# Patient Record
Sex: Male | Born: 1975
Health system: Southern US, Community
[De-identification: ages and names within clinical notes are randomized; demographics above are authoritative.]

## PROBLEM LIST (undated history)

## (undated) DIAGNOSIS — F32A Depression, unspecified: Secondary | ICD-10-CM

## (undated) DIAGNOSIS — M1990 Unspecified osteoarthritis, unspecified site: Secondary | ICD-10-CM

## (undated) DIAGNOSIS — Z8619 Personal history of other infectious and parasitic diseases: Secondary | ICD-10-CM

## (undated) DIAGNOSIS — F329 Major depressive disorder, single episode, unspecified: Secondary | ICD-10-CM

## (undated) DIAGNOSIS — F1911 Other psychoactive substance abuse, in remission: Secondary | ICD-10-CM

## (undated) DIAGNOSIS — Z789 Other specified health status: Secondary | ICD-10-CM

## (undated) DIAGNOSIS — F419 Anxiety disorder, unspecified: Secondary | ICD-10-CM

## (undated) HISTORY — DX: Depression, unspecified: F32.A

## (undated) HISTORY — DX: Major depressive disorder, single episode, unspecified: F32.9

## (undated) HISTORY — DX: Anxiety disorder, unspecified: F41.9

## (undated) HISTORY — DX: Unspecified osteoarthritis, unspecified site: M19.90

## (undated) HISTORY — DX: Other specified health status: Z78.9

## (undated) HISTORY — PX: SHOULDER ARTHROSCOPY: SHX128

## (undated) HISTORY — DX: Personal history of other infectious and parasitic diseases: Z86.19

## (undated) HISTORY — DX: Other psychoactive substance abuse, in remission: F19.11

---

## 2015-07-22 ENCOUNTER — Emergency Department (HOSPITAL_COMMUNITY)
Admission: EM | Admit: 2015-07-22 | Discharge: 2015-07-22 | Disposition: A | Discharge status: Home / Self Care | Payer: 59 | Attending: Emergency Medicine | Admitting: Emergency Medicine

## 2015-07-22 ENCOUNTER — Encounter (HOSPITAL_COMMUNITY): Payer: Self-pay | Admitting: *Deleted

## 2015-07-22 DIAGNOSIS — S01111A Laceration without foreign body of right eyelid and periocular area, initial encounter: Secondary | ICD-10-CM | POA: Diagnosis not present

## 2015-07-22 DIAGNOSIS — S0993XA Unspecified injury of face, initial encounter: Secondary | ICD-10-CM | POA: Diagnosis present

## 2015-07-22 DIAGNOSIS — Y9389 Activity, other specified: Secondary | ICD-10-CM | POA: Insufficient documentation

## 2015-07-22 DIAGNOSIS — Y998 Other external cause status: Secondary | ICD-10-CM | POA: Diagnosis not present

## 2015-07-22 DIAGNOSIS — W228XXA Striking against or struck by other objects, initial encounter: Secondary | ICD-10-CM | POA: Diagnosis not present

## 2015-07-22 DIAGNOSIS — Z23 Encounter for immunization: Secondary | ICD-10-CM | POA: Diagnosis not present

## 2015-07-22 DIAGNOSIS — Y9289 Other specified places as the place of occurrence of the external cause: Secondary | ICD-10-CM | POA: Insufficient documentation

## 2015-07-22 MED ORDER — TETANUS-DIPHTH-ACELL PERTUSSIS 5-2.5-18.5 LF-MCG/0.5 IM SUSP
0.5000 mL | Freq: Once | INTRAMUSCULAR | Status: AC
  Administered 2015-07-22: 0.5 mL via INTRAMUSCULAR
  Filled 2015-07-22: qty 0.5

## 2015-07-22 MED ORDER — LIDOCAINE-EPINEPHRINE (PF) 2 %-1:200000 IJ SOLN
20.0000 mL | Freq: Once | INTRAMUSCULAR | Status: AC
  Administered 2015-07-22: 20 mL
  Filled 2015-07-22: qty 20

## 2015-07-22 NOTE — Discharge Instructions (Signed)
Keep wound clean with mild soap and water. Keep area covered with a topical antibiotic ointment and bandage, keep bandage dry, and do not submerge in water for 24 hours. Ice and elevate for additional pain relief and swelling. Alternate between Naprosyn and Tylenol for additional pain relief. Follow up with your primary care doctor or the [facility] in approximately 7-10 days for wound recheck and suture removal. Monitor area for signs of infection to include, but not limited to: increasing pain, redness, drainage/pus, or swelling. Return to emergency department for emergent changing or worsening symptoms.    Facial Laceration A facial laceration is a cut on the face. These injuries can be painful and cause bleeding. Some cuts may need to be closed with stitches (sutures), skin adhesive strips, or wound glue. Cuts usually heal quickly but can leave a scar. It can take 1-2 years for the scar to go away completely. HOME CARE   Only take medicines as told by your doctor.  Follow your doctor's instructions for wound care. For Stitches:  Keep the cut clean and dry.  If you have a bandage (dressing), change it at least once a day. Change the bandage if it gets wet or dirty, or as told by your doctor.  Wash the cut with soap and water 2 times a day. Rinse the cut with water. Pat it dry with a clean towel.  Put a thin layer of medicated cream on the cut as told by your doctor.  You may shower after the first 24 hours. Do not soak the cut in water until the stitches are removed.  Have your stitches removed as told by your doctor.  Do not wear any makeup until a few days after your stitches are removed. For Skin Adhesive Strips:  Keep the cut clean and dry.  Do not get the strips wet. You may take a bath, but be careful to keep the cut dry.  If the cut gets wet, pat it dry with a clean towel.  The strips will fall off on their own. Do not remove the strips that are still  stuck to the cut. For Wound Glue:  You may shower or take baths. Do not soak or scrub the cut. Do not swim. Avoid heavy sweating until the glue falls off on its own. After a shower or bath, pat the cut dry with a clean towel.  Do not put medicine or makeup on your cut until the glue falls off.  If you have a bandage, do not put tape over the glue.  Avoid lots of sunlight or tanning lamps until the glue falls off.  The glue will fall off on its own in 5-10 days. Do not pick at the glue. After Healing: Put sunscreen on the cut for the first year to reduce your scar. GET HELP RIGHT AWAY IF:   Your cut area gets red, painful, or puffy (swollen).  You see a yellowish-white fluid (pus) coming from the cut.  You have chills or a fever. MAKE SURE YOU:   Understand these instructions.  Will watch your condition.  Will get help right away if you are not doing well or get worse. Document Released: 05/13/2008 Document Revised: 09/15/2013 Document Reviewed: 07/08/2013 [facility] Patient Information 2015 [place], [place]. This information is not intended to replace advice given to you by your health care provider. Make sure you discuss any questions you have with your health care provider.   Sutured Wound Care Sutures are  stitches that can be used to close wounds. Caring for your wound can help stop infection and lessen pain. HOME CARE   Rest and raise (elevate) the injured area until the pain and puffiness (swelling) go away.  Only take medicines as told by your doctor.  Clean the wound gently with mild soap and water once a day after the first 2 days. Rinse off the soap. Pat the area dry with a clean towel. Do not rub the wound.  Change the bandage (dressing) as told by your doctor. If the bandage sticks, soak it off with soapy water. Stop using a bandage after 2 days or after the wound stops leaking fluid.  Put cream on the wound as told by your doctor.  Do not stretch the  wound.  Drink enough fluids to keep your pee (urine) clear or pale yellow.  See your doctor to have the sutures removed.  Use sunscreen or sunblock on the wound after it heals. GET HELP RIGHT AWAY IF:   Your wound gets red, puffy, hot, or tender.  You have more pain in the wound.  You have a red streak that goes away from the wound.  You see yellowish-white fluid (pus) coming out of the wound.  You have a fever.  You have chills and start to shake.  You notice a bad smell coming from the wound.  Your wound will not stop bleeding. MAKE SURE YOU:   Understand these instructions.  Will watch your condition.  Will get help right away if you are not doing well or get worse. Document Released: 05/13/2008 Document Revised: 02/17/2012 Document Reviewed: 03/31/2011 [facility] Patient Information 2015 [place], [place]. This information is not intended to replace advice given to you by your health care provider. Make sure you discuss any questions you have with your health care provider.

## 2015-07-22 NOTE — ED Notes (Signed)
Pt has 2.5cm laceration to right eyebrow. Pt states he was hit with knee while playing ju jitsu. Pt denies eye pain.

## 2015-07-22 NOTE — ED Provider Notes (Signed)
CSN: [id number]     Arrival date & time 07/22/15  1440 History  This chart was scribed for Chase Strauss, PA-C, working with Laurence Spates, MD by Chestine Spore, ED Scribe. The patient was seen in room WTR8/WTR8 at 3:05 PM.    Chief Complaint  Patient presents with  . Facial Laceration      Patient is a 39 y.o. male presenting with skin laceration. The history is provided by the patient. No language interpreter was used.  Laceration Location:  Face Facial laceration location:  R eyebrow Length (cm):  2.5 cm Depth:  Cutaneous Quality: straight   Bleeding: controlled   Time since incident:  3 hours Injury mechanism: knee while practicing ju jitsu. Pain details:    Quality:  Unable to specify   Severity:  No pain   Progression:  Unchanged Foreign body present:  No foreign bodies Relieved by: neosporin, co-band, and gauze. Worsened by:  Nothing tried Ineffective treatments:  None tried Tetanus status:  Out of date   Chase Nunez is a 39 y.o. male with no significant PMHx, who presents to the Emergency department complaining of right eyebrow laceration onset 3 hours ago PTA. Pt reports that he was practicing ju jitsu when he was hit in the right eyebrow with a knee. Pt denies pain. He notes that he has used neosporin, co-band, and gauze to the lac PTA, which helped stop the bleeding. He denies blurred vision, eye pain, photophobia, vision disturbance, HA, lightheaded, LOC, back pain, neck pain, CP, SOB, abdominal pain, n/v/d/c, hematuria, dysuria, bowel/bladder incontinence, numbness, tingling, weakness, bruising, or swelling. Pt is not allergic to any medications. Pt denies DM, HIV, other immunocompromising conditions, or any bleeding issues. Pt notes that he believes that his last tetanus shot was within the last 67yrs.   History reviewed. No pertinent past medical history. History reviewed. No pertinent past surgical history. No family history on file. Social History   Substance Use Topics  . Smoking status: Never Smoker   . Smokeless tobacco: None  . Alcohol Use: Yes    Review of Systems  Constitutional: Negative for fever and chills.  HENT: Negative for facial swelling.   Eyes: Negative for photophobia, pain and visual disturbance.  Respiratory: Negative for shortness of breath.   Cardiovascular: Negative for chest pain.  Gastrointestinal: Negative for nausea, vomiting, abdominal pain and diarrhea.       No bowel incontinence  Genitourinary: Negative for dysuria, hematuria and difficulty urinating.       No bladder incontinence  Musculoskeletal: Negative for back pain, joint swelling, arthralgias, gait problem and neck pain.  Skin: Positive for wound (right eyebrow laceration).  Allergic/Immunologic: Negative for immunocompromised state.  Neurological: Negative for syncope, weakness, light-headedness, numbness and headaches.       No tingling  Hematological: Does not bruise/bleed easily.   A complete 10 system review of systems was obtained and all systems are negative except as noted in the HPI and PMH.     Allergies  Review of patient's allergies indicates not on file.  Home Medications   Prior to Admission medications   Not on File   BP 141/77 mmHg  Pulse 53  Temp(Src) 98.6 F (37 C) (Oral)  Resp 18  SpO2 97% Physical Exam  Constitutional: He is oriented to person, place, and time. Vital signs are normal. He appears well-developed and well-nourished.  Non-toxic appearance. No distress.  Afebrile, nontoxic, NAD  HENT:  Head: Normocephalic. Head is with laceration. Head is without  raccoon's eyes, without Battle's sign and without contusion.  Mouth/Throat: Mucous membranes are normal.  Approximately 2.5 cm laceration to the right eyebrow, bleeding controlled, with no scalp or facial TTP, no racoon eyes or battle's sign, no contusion. No facial asymmetry  Eyes: Conjunctivae and EOM are normal. Pupils are equal, round, and reactive  to light. Right eye exhibits no discharge. Left eye exhibits no discharge.  PERRL, EOMI, no nystagmus, no visual field deficits. Right eyebrow lac as noted above. SEE PICTURE BELOW Visual Acuity - Bilateral Distance: 20/15 ; R Distance: 20/20 ; L Distance: 20/25  Neck: Normal range of motion. Neck supple.  Cardiovascular: Normal rate.   Pulmonary/Chest: Effort normal. No respiratory distress.  Abdominal: Normal appearance. He exhibits no distension.  Musculoskeletal: Normal range of motion.  Neurological: He is alert and oriented to person, place, and time. He has normal strength. No sensory deficit.  Skin: Skin is warm and dry. Laceration noted. No rash noted.  Psychiatric: He has a normal mood and affect.  Nursing note and vitals reviewed.       ED Course  LACERATION REPAIR Date/Time: 07/22/2015 3:20 PM Performed by: Allen Derry Authorized by: Allen Derry Consent: Verbal consent obtained. Risks and benefits: risks, benefits and alternatives were discussed Consent given by: patient Patient understanding: patient states understanding of the procedure being performed Patient consent: the patient's understanding of the procedure matches consent given Patient identity confirmed: verbally with patient Body area: head/neck Location details: right eyebrow Laceration length: 2.5 cm Foreign bodies: no foreign bodies Tendon involvement: none Nerve involvement: none Vascular damage: no Anesthesia: local infiltration Local anesthetic: lidocaine 2% without epinephrine and lidocaine 2% with epinephrine Anesthetic total: 3 ml Patient sedated: no Preparation: Patient was prepped and draped in the usual sterile fashion. Irrigation solution: saline Irrigation method: syringe Amount of cleaning: standard Debridement: none Degree of undermining: none Skin closure: 5-0 Prolene Number of sutures: 3 Technique: simple Approximation: close Approximation difficulty:  simple Dressing: 4x4 sterile gauze and antibiotic ointment Patient tolerance: Patient tolerated the procedure well with no immediate complications   (including critical care time) DIAGNOSTIC STUDIES: Oxygen Saturation is 97% on RA, nl by my interpretation.    COORDINATION OF CARE: 3:10 PM Discussed treatment plan with pt at bedside and pt agreed to plan.  3:19 PM- 2% Lidocaine with Epinephrine, 3 ml used, 5-0 Prolene, 3 sutures placed to right eyebrow.   Labs Review Labs Reviewed - No data to display  Imaging Review No results found. I, Camprubi[place]Soms, [place], personally reviewed and evaluated these images and lab results as part of my medical decision-making.   EKG Interpretation None      MDM   Final diagnoses:  Eyebrow laceration, right, initial encounter    39 y.o. male here with R eyebrow lac sustained just PTA. No pain. No bruising or tenderness. No focal neuro deficits. Sutured with 5-0 prolene, discussed f/up in 7-10 days for suture removal at PCPs office. Discussed proper wound care. Tetanus updated. I explained the diagnosis and have given explicit precautions to return to the ER including for any other new or worsening symptoms. The patient understands and accepts the medical plan as it's been dictated and I have answered their questions. Discharge instructions concerning home care and prescriptions have been given. The patient is STABLE and is discharged to home in good condition.   I personally performed the services described in this documentation, which was scribed in my presence. The recorded information has been reviewed and is accurate.  BP 141/77 mmHg  Pulse 53  Temp(Src) 98.6 F (37 C) (Oral)  Resp 18  SpO2 97%  Meds ordered this encounter  Medications  . lidocaine-EPINEPHrine (XYLOCAINE W/EPI) 2 %-1:200000 (PF) injection 20 mL    Sig:   . Tdap (BOOSTRIX) injection 0.5 mL    SigAllen Derry, PA-C 07/22/15 1556  Laurence Spates, MD 07/23/15 [phone]

## 2017-01-31 MED FILL — FLUoxetine HCL 20 MG CAPS: 20 | 30 days supply | Qty: 60 | Fill #0

## 2017-03-03 MED FILL — FLUoxetine HCL 20 MG CAPS: 20 | 30 days supply | Qty: 60 | Fill #1

## 2017-04-08 ENCOUNTER — Ambulatory Visit (INDEPENDENT_AMBULATORY_CARE_PROVIDER_SITE_OTHER): Payer: 59 | Admitting: Family Medicine

## 2017-04-08 ENCOUNTER — Encounter: Payer: Self-pay | Admitting: Family Medicine

## 2017-04-08 VITALS — BP 148/92 | HR 56 | Temp 98.3°F | Resp 17 | Ht 66.5 in | Wt 210.0 lb

## 2017-04-08 DIAGNOSIS — F39 Unspecified mood [affective] disorder: Secondary | ICD-10-CM | POA: Diagnosis not present

## 2017-04-08 DIAGNOSIS — F429 Obsessive-compulsive disorder, unspecified: Secondary | ICD-10-CM

## 2017-04-08 DIAGNOSIS — Z1329 Encounter for screening for other suspected endocrine disorder: Secondary | ICD-10-CM

## 2017-04-08 DIAGNOSIS — F411 Generalized anxiety disorder: Secondary | ICD-10-CM | POA: Diagnosis not present

## 2017-04-08 DIAGNOSIS — G894 Chronic pain syndrome: Secondary | ICD-10-CM

## 2017-04-08 DIAGNOSIS — Z5181 Encounter for therapeutic drug level monitoring: Secondary | ICD-10-CM

## 2017-04-08 DIAGNOSIS — R4586 Emotional lability: Secondary | ICD-10-CM

## 2017-04-08 MED ORDER — DULOXETINE HCL 30 MG PO CPEP: 30.0000 mg | ORAL_CAPSULE | Freq: Two times a day (BID) | ORAL | 0 refills | Status: DC

## 2017-04-08 MED FILL — DULoxetine HCL 30 MG CPEP: 30 | 30 days supply | Qty: 60 | Fill #0

## 2017-04-08 NOTE — Patient Instructions (Addendum)
Stop Prozac, start Cymbalta 1 pill per day for the next 5-7 days, then increase to twice per day as long as you tolerate that dose. Follow-up with me within 3-4 weeks so you do not run out of medication. If you are tolerating the 30 mg twice per day at that time, we will switch to a once a day formulation. Decrease alcohol to no more than 1 drink per day. If you have difficulty cutting back, let me know and I can provide some resources. Let me know if you have questions in the meantime    IF you received an x-ray today, you will receive an invoice from [facility]. Please contact [facility] at [phone] with questions or concerns regarding your invoice.   IF you received labwork today, you will receive an invoice from [place]. Please contact [facility] at [phone] with questions or concerns regarding your invoice.   Our billing staff will not be able to assist you with questions regarding bills from these companies.  You will be contacted with the lab results as soon as they are available. The fastest way to get your results is to activate your My Chart account. Instructions are located on the last page of this paperwork. If you have not heard from [place] regarding the results in 2 weeks, please contact this office.

## 2017-04-08 NOTE — Progress Notes (Signed)
By signing my name below, I, [name], attest that this documentation has been prepared under the direction and in the presence of Meredith Staggers, MD.  Electronically Signed: Arvilla Market, Medical Scribe. 04/08/17. 12:23 PM.  Subjective:    Patient ID: Chase Nunez, male    DOB: May 08, 1976, 41 y.o.   MRN: [id number]  HPI Chief Complaint  Patient presents with  . Establish Care    HPI Comments: Chase Nunez is a 41 y.o. male who presents to the Primary Care at [facility] to establish care.   OCD/Anxiety: Pt has been taking fluoxetine 20 mg BID for OCD and anxiety, but for the past week he's been taking it QD since he would run out soon. His last dosage was this morning. States it's been a "roller coaster" with more anxiety, and easy agitation that last 30 mins until he's clear headed later. He's been drinking more heavily, 14 a week (2 liquor drinks a day and some days more drinks). Pt goes through a routine before leaving the house since he doesn't want to do anything when he gets back home.  FHx: Dad had thyroid issues - night sweats.  Pain: Reports upper back pain, bilateral arm pain, and it feels llike someone is peeling his scapula. Reports shoulder surgery in the past but states it's fine now. 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 He's in constant pain, especially after he gets home . Pt is trying CBD oil without THC for relief of his pain. Denies SI, and depression.   Pt has a competition this weekend.  There are no active problems to display for this patient.  Past Medical History:  Diagnosis Date  . Arthritis   . Depression    Past Surgical History:  Procedure Laterality Date  . SHOULDER ARTHROSCOPY     Not on File Prior to Admission medications   Not on File   Social History   Social History  . Marital status: Married    Spouse name: N/A  . Number of children: N/A  . Years of education: N/A   Occupational History  . Not on  file.   Social History Main Topics  . Smoking status: Never Smoker  . Smokeless tobacco: Current User    Types: Snuff  . Alcohol use Yes  . Drug use: No  . Sexual activity: No   Other Topics Concern  . Not on file   Social History Narrative  . No narrative on file   Review of Systems  Musculoskeletal: Positive for back pain and myalgias.  Psychiatric/Behavioral: Positive for agitation. Negative for dysphoric mood, self-injury and suicidal ideas. The patient is nervous/anxious.    Objective:  Physical Exam  Constitutional: He appears well-developed and well-nourished. No distress.  HENT:  Head: Normocephalic and atraumatic.  Eyes: Conjunctivae are normal.  Neck: Neck supple.  Cardiovascular: Normal rate, regular rhythm and normal heart sounds.  Exam reveals no gallop and no friction rub.   No murmur heard. Pulmonary/Chest: Effort normal and breath sounds normal. No respiratory distress. He has no decreased breath sounds. He has no wheezes. He has no rhonchi. He has no rales.  Neurological: He is alert.  Skin: Skin is warm and dry.  Psychiatric: He has a normal mood and affect. His behavior is normal.  Nursing note and vitals reviewed.   Vitals:   04/08/17 1144  BP: (!) 148/92  Pulse: (!) 56  Resp: 17  Temp: 98.3 F (36.8  C)  TempSrc: Oral  SpO2: 99%  Weight: 210 lb (95.3 kg)  Height: 5' 6.5" (1.689 m)   Body mass index is 33.39 kg/m. Assessment & Plan:   Chase Nunez is a 41 y.o. male Generalized anxiety disorder - Plan: TSH, DULoxetine (CYMBALTA) 30 MG capsule, Care order/instruction: Mood swings (HCC) - Plan: TSH, DULoxetine (CYMBALTA) 30 MG capsule Obsessive-compulsive disorder, unspecified type - Plan: TSH, DULoxetine (CYMBALTA) 30 MG capsule  - History of OCD, but on discussion likely has component of generalized anxiety with recent worsening in mood swings.  - With chronic pain component, decided to try Cymbalta. Start 30 mg daily for 4-5 days at least, then  changed to 30 mg twice a day if tolerated. Recheck in 3-4 weeks and if tolerated and 60 mg per day dose, will switch to 60 mg capsule daily.  - Advised to decrease alcohol use to no more than 1 drink per day. If difficulty cutting back, can provide resources.  - Check TSH  Screening for thyroid disorder - Plan: TSH  Medication monitoring encounter - Plan: Basic metabolic panel  - Check creatinine for Cymbalta. Plans on cutting wait for competition soon. Maintain hydration for renal function.  Chronic pain syndrome  - As above will try Cymbalta, and advised to follow-up to discuss specific areas of pain further as may need other treatment or evaluation.  Meds ordered this encounter  Medications  . DISCONTD: FLUoxetine (PROZAC) 20 MG tablet    Sig: Take 20 mg by mouth daily.  . Multiple Vitamins-Minerals (MULTIVITAMIN WITH MINERALS) tablet    Sig: Take 1 tablet by mouth daily.  . Turmeric 500 MG CAPS    Sig: Take by mouth.  . DULoxetine (CYMBALTA) 30 MG capsule    Sig: Take 1 capsule (30 mg total) by mouth 2 (two) times daily. Start with 1 capsule QD for 1 week., then increase to BID.    Dispense:  60 capsule    Refill:  0   Patient Instructions   Stop Prozac, start Cymbalta 1 pill per day for the next 5-7 days, then increase to twice per day as long as you tolerate that dose. Follow-up with me within 3-4 weeks so you do not run out of medication. If you are tolerating the 30 mg twice per day at that time, we will switch to a once a day formulation. Decrease alcohol to no more than 1 drink per day. If you have difficulty cutting back, let me know and I can provide some resources. Let me know if you have questions in the meantime    IF you received an x-ray today, you will receive an invoice from [facility]. Please contact [facility] at [phone] with questions or concerns regarding your invoice.   IF you received labwork today, you will receive an invoice from  [place]. Please contact [facility] at [phone] with questions or concerns regarding your invoice.   Our billing staff will not be able to assist you with questions regarding bills from these companies.  You will be contacted with the lab results as soon as they are available. The fastest way to get your results is to activate your My Chart account. Instructions are located on the last page of this paperwork. If you have not heard from [place] regarding the results in 2 weeks, please contact this office.       I personally performed the services described in this documentation, which was scribed in my presence. The recorded information has been  reviewed and considered for accuracy and completeness, addended by me as needed, and agree with information above.  Signed,   Meredith Staggers, MD Primary Care at [facility].  04/09/17 9:22 AM

## 2017-04-09 LAB — BASIC METABOLIC PANEL
BUN/Creatinine Ratio: 20 (ref 9–20)
BUN: 16 mg/dL (ref 6–20)
CO2: 25 mmol/L (ref 18–29)
Calcium: 9.5 mg/dL (ref 8.7–10.2)
Chloride: 99 mmol/L (ref 96–106)
Creatinine, Ser: 0.8 mg/dL (ref 0.76–1.27)
GFR calc Af Amer: 131 mL/min/{1.73_m2} (ref >59)
GFR calc non Af Amer: 113 mL/min/{1.73_m2} (ref >59)
Glucose: 106 mg/dL — ABNORMAL HIGH (ref 65–99)
Potassium: 5.2 mmol/L (ref 3.5–5.2)
Sodium: 138 mmol/L (ref 134–144)

## 2017-04-09 LAB — TSH: TSH: 2.13 u[IU]/mL (ref 0.450–4.500)

## 2017-05-07 ENCOUNTER — Telehealth: Payer: Self-pay | Admitting: Family Medicine

## 2017-05-07 NOTE — Telephone Encounter (Signed)
FROM LAST OFFICE VISIT NOTE:  Stop Prozac, start Cymbalta 1 pill per day for the next 5-7 days, then increase to twice per day as long as you tolerate that dose. Follow-up with me within 3-4 weeks so you do not run out of medication   HE NEEDS NEEDS AN APPT THIS WEEK, PLEASE SCHEDULE

## 2017-05-07 NOTE — Telephone Encounter (Addendum)
PATIENT WOULD LIKE DR. GREENE TO KNOW THAT HE WILL RUN OUT OF HIS CYMBALTA 30 MG THIS WEEK FOR HIS ANXIETY. HE SAID DR. GREENE TOLD HIM THAT HE WAS GOING TO INCREASE HIS DOSAGE BUT HE IS NOT SURE HOW MUCH? HE ALSO IS NOT SURE WHEN DR. Neva SeatGREENE TOLD HIM TO SCHEDULE HIS NEXT OFFICE VISIT APPOINTMENT WITH HIM? BEST PHONE [phone] (CELL) PHARMACY CHOICE IS [place] [facility]. MBC

## 2017-05-08 ENCOUNTER — Encounter: Payer: Self-pay | Admitting: Family Medicine

## 2017-05-08 ENCOUNTER — Ambulatory Visit (INDEPENDENT_AMBULATORY_CARE_PROVIDER_SITE_OTHER): Payer: 59 | Admitting: Family Medicine

## 2017-05-08 VITALS — BP 130/81 | HR 65 | Temp 98.0°F | Resp 18 | Ht 66.5 in | Wt 205.0 lb

## 2017-05-08 DIAGNOSIS — R4586 Emotional lability: Secondary | ICD-10-CM

## 2017-05-08 DIAGNOSIS — F39 Unspecified mood [affective] disorder: Secondary | ICD-10-CM

## 2017-05-08 DIAGNOSIS — Z789 Other specified health status: Secondary | ICD-10-CM

## 2017-05-08 DIAGNOSIS — F411 Generalized anxiety disorder: Secondary | ICD-10-CM | POA: Diagnosis not present

## 2017-05-08 DIAGNOSIS — Z7289 Other problems related to lifestyle: Secondary | ICD-10-CM

## 2017-05-08 DIAGNOSIS — F429 Obsessive-compulsive disorder, unspecified: Secondary | ICD-10-CM | POA: Diagnosis not present

## 2017-05-08 DIAGNOSIS — M791 Myalgia, unspecified site: Secondary | ICD-10-CM

## 2017-05-08 MED ORDER — DULOXETINE HCL 60 MG PO CPEP: 60.0000 mg | ORAL_CAPSULE | Freq: Every day | ORAL | 1 refills | Status: DC

## 2017-05-08 MED FILL — DULoxetine HCL 60 MG CPEP: 60 | 90 days supply | Qty: 90 | Fill #0 | Status: TO

## 2017-05-08 NOTE — Patient Instructions (Addendum)
  Continue 60mg  Cymbalta once per day. If still having some increased anxiety in next 3-4 weeks, could add 30mg  dose.   If you continue to have difficulty with sexual side effects of your medicine, or that worsens - can look at other options or possibly add Wellbutrin. No change for now.   Recheck in 3 months.    IF you received an x-ray today, you will receive an invoice from [facility]. Please contact [facility] at [phone] with questions or concerns regarding your invoice.   IF you received labwork today, you will receive an invoice from [facility]. Please contact [facility] at [phone] with questions or concerns regarding your invoice.   Our billing staff will not be able to assist you with questions regarding bills from these companies.  You will be contacted with the lab results as soon as they are available. The fastest way to get your results is to activate your My Chart account. Instructions are located on the last page of this paperwork. If you have not heard from us regarding the results in 2 weeks, please contact this office.

## 2017-05-08 NOTE — Progress Notes (Signed)
By signing my name below, I, [name], attest that this documentation has been prepared under the direction and in the presence of Meredith StaggersJeffrey Yosselyn Tax, MD.  Electronically Signed: Arvilla MarketMesha Guinyard, Medical Scribe. 05/08/17. 11:06 AM.  Subjective:    Patient ID: Chase CosierJames Nunez, male    DOB: 01/06/1976, 41 y.o.   MRN: [id number]  HPI Chief Complaint  Patient presents with  . medication change    Cymbalta and to increase the dose  . Follow-up    HPI Comments: Chase CosierJames Caterino is a 41 y.o. male who [facility] for anxiety follow-up. He was last seen May 1st to establish care. He was having inc anxiety on lower dose of prozac at that time. Increased alcohol, was having some diffuse myalgias; thought to be in part to overuse, and some component of chronic pain. We decided to changing to cymbalta 30 mg to see if that would help chronic pain component as well as anxiety and depression. He was advised to increase if tolerated and to decreased alcohol use. TSH was nl.  He's currently taking cymbalta 30 mg BID (occasionally both in the morning and occasionally 1 in the moring and 1 in the afternoon), and initially took it QD the first 1.5 weeks with relief of his sxs. His wife has noticed improved mood and  he has become more relaxed on his routine of having things put up in the house. In the past he would check multiple times, but he does it less frequently. Pt's pain has also been relieved since starting cymbalta, but he has noticed prolonged orgasms (trouble "finishing"). He drinks less, now at least 1 drink late night before bed on the weekdays. Denies SI/HI, and thoughts of self harm.   Hyperglycemia: Borderline glucose of 106  There are no active problems to display for this patient.  Past Medical History:  Diagnosis Date  . Arthritis   . Depression    Past Surgical History:  Procedure Laterality Date  . SHOULDER ARTHROSCOPY     No Known Allergies Prior to Admission  medications   Medication Sig Start Date End Date Taking? Authorizing Provider  DULoxetine (CYMBALTA) 30 MG capsule Take 1 capsule (30 mg total) by mouth 2 (two) times daily. Start with 1 capsule QD for 1 week., then increase to BID. 04/08/17  Yes Shade FloodGreene, Damarkus Balis R, MD  Multiple Vitamins-Minerals (MULTIVITAMIN WITH MINERALS) tablet Take 1 tablet by mouth daily.   Yes [provider]  Turmeric 500 MG CAPS Take by mouth.   Yes [provider]   Social History   Social History  . Marital status: Married    Spouse name: N/A  . Number of children: N/A  . Years of education: N/A   Occupational History  . Not on file.   Social History Main Topics  . Smoking status: Never Smoker  . Smokeless tobacco: Current User    Types: Snuff  . Alcohol use Yes  . Drug use: No  . Sexual activity: No   Other Topics Concern  . Not on file   Social History Narrative  . No narrative on file   Review of Systems  Musculoskeletal: Negative for myalgias.  Psychiatric/Behavioral: Negative for agitation, dysphoric mood, self-injury and suicidal ideas. The patient is not nervous/anxious.    Objective:  Physical Exam  Constitutional: He appears well-developed and well-nourished. No distress.  HENT:  Head: Normocephalic and atraumatic.  Eyes: Conjunctivae are normal.  Neck: Neck supple.  Cardiovascular: Normal rate, regular  rhythm and normal heart sounds.  Exam reveals no gallop and no friction rub.   No murmur heard. Pulmonary/Chest: Effort normal and breath sounds normal. No respiratory distress. He has no wheezes. He has no rales.  Neurological: He is alert.  Skin: Skin is warm and dry.  Psychiatric: He has a normal mood and affect. His behavior is normal.  Nursing note and vitals reviewed.   Vitals:   05/08/17 1033  BP: (!) 147/87  Pulse: 65  Resp: 18  Temp: 98 F (36.7 C)  TempSrc: Oral  SpO2: 99%  Weight: 205 lb (93 kg)  Height: 5' 6.5" (1.689 m)  Body mass index is  32.59 kg/m. Assessment & Plan:   Marquee Fuchs is a 41 y.o. male Obsessive-compulsive disorder, unspecified type - Plan: DULoxetine (CYMBALTA) 60 MG capsule Generalized anxiety disorder - Plan: DULoxetine (CYMBALTA) 60 MG capsule Mood swings (HCC) - Plan: DULoxetine (CYMBALTA) 60 MG capsule  - Improved with Cymbalta, discussed typical side effects of this medication including sexual side effects. Declined any changes at this time, will continue Cymbalta 60 mg daily, recheck in 3 months  Alcohol use  -Stable, has decreased use, continue to avoid heavy use.  Myalgia  -Improved with use of Cymbalta. RTC precautions if acute worsening of one area  Meds ordered this encounter  Medications  . DULoxetine (CYMBALTA) 60 MG capsule    Sig: Take 1 capsule (60 mg total) by mouth daily.    Dispense:  90 capsule    Refill:  1   Patient Instructions    Continue 60mg  Cymbalta once per day. If still having some increased anxiety in next 3-4 weeks, could add 30mg  dose.   If you continue to have difficulty with sexual side effects of your medicine, or that worsens - can look at other options or possibly add Wellbutrin. No change for now.   Recheck in 3 months.    IF you received an x-ray today, you will receive an invoice from [facility]. Please contact [facility] at [phone] with questions or concerns regarding your invoice.   IF you received labwork today, you will receive an invoice from [facility]. Please contact [facility] at [phone] with questions or concerns regarding your invoice.   Our billing staff will not be able to assist you with questions regarding bills from these companies.  You will be contacted with the lab results as soon as they are available. The fastest way to get your results is to activate your My Chart account. Instructions are located on the last page of this paperwork. If you have not heard from [place] regarding the results in 2 weeks, please contact  this office.       I personally performed the services described in this documentation, which was scribed in my presence. The recorded information has been reviewed and considered for accuracy and completeness, addended by me as needed, and agree with information above.  Signed,   Meredith Staggers, MD Primary Care at [facility].  05/10/17 11:53 AM

## 2017-08-13 ENCOUNTER — Telehealth: Payer: Self-pay | Admitting: Family Medicine

## 2017-08-13 NOTE — Telephone Encounter (Signed)
Pt states that he is out of town and forgot to go by [facility] and get his last refill on the cymbalta and they are [facility] to [facility] [place] phone number [phone] and pt was hoping that Dr. Neva SeatGreene could call in a [facility] at the [facility]   Best number [phone]

## 2017-08-14 ENCOUNTER — Ambulatory Visit: Payer: 59 | Admitting: Family Medicine

## 2017-08-14 NOTE — Telephone Encounter (Signed)
I sent a prescription for Cymbalta to the requested pharmacy. I did want to follow-up with him in 3 months when we talked in May, so please schedule appointment when he gets back from out of town.

## 2017-11-04 ENCOUNTER — Ambulatory Visit (INDEPENDENT_AMBULATORY_CARE_PROVIDER_SITE_OTHER): Payer: 59 | Admitting: Physician Assistant

## 2017-11-04 ENCOUNTER — Other Ambulatory Visit: Payer: Self-pay

## 2017-11-04 ENCOUNTER — Encounter: Payer: Self-pay | Admitting: Physician Assistant

## 2017-11-04 VITALS — BP 120/84 | HR 59 | Temp 98.2°F | Resp 16 | Ht 66.0 in | Wt 204.8 lb

## 2017-11-04 DIAGNOSIS — Z79899 Other long term (current) drug therapy: Secondary | ICD-10-CM | POA: Diagnosis not present

## 2017-11-04 DIAGNOSIS — F429 Obsessive-compulsive disorder, unspecified: Secondary | ICD-10-CM | POA: Diagnosis not present

## 2017-11-04 DIAGNOSIS — F411 Generalized anxiety disorder: Secondary | ICD-10-CM

## 2017-11-04 MED ORDER — FLUOXETINE HCL 20 MG PO TABS: 20.0000 mg | ORAL_TABLET | Freq: Every day | ORAL | 0 refills | Status: DC

## 2017-11-04 MED FILL — FLUoxetine HCL 20 MG CAPS: 20 | 44 days supply | Qty: 90 | Fill #0

## 2017-11-04 NOTE — Progress Notes (Signed)
Chase Nunez  MRN: [id number] DOB: 1976/01/21  PCP: Shade Flood, MD  Subjective:  Pt is a 41 year old male who presents to clinic for medication problem. He is here today with his wife.   Anxiety - Estab care with colleague Dr Neva Seat May 1 - pt was experiencing increased anxiety on Prozac at that time. Started Cymbalta  bid. He was taking  qd. Wife says there was not much change in behavior. He got off this medication a few months ago - symptoms have been worsening.    OCD - circles the house several times at night or when leaving the house to lock it up.  Mood disorder - he gets very angry at small problems. He may burst out verbally, but never physically.  Chronic pain - mostly from wrestling. Cymbalta did not work. Used to self medicate with alcohol.  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  Supportive wife of 23 years.  He has a 74 year old son who "boomeranged" back home after college. He is "spoiled" and makes him very angry sometimes.   Moved from [place] 3 years ago. His symptoms have worsened as he was "uprooted" from friends. Move due to wife's new job with [employer].   Has been on Prozac in the past. This helped.  Zoloft did not help.  He has seen Mrs. Titus in the past for counseling - did not get along well - she did not believe him.  His mother has similar behavioral problems but with more severe outbursts.   Review of Systems  Cardiovascular: Negative for chest pain and palpitations.  Gastrointestinal: Negative for abdominal pain, nausea and vomiting.  Musculoskeletal: Positive for arthralgias and myalgias.  Psychiatric/Behavioral: Positive for agitation and behavioral problems. Negative for decreased concentration, self-injury and suicidal ideas. The patient is nervous/anxious.     There are no active problems to display for this patient.   Current Outpatient Medications on File Prior to Visit  Medication Sig Dispense Refill    . Multiple Vitamins-Minerals (MULTIVITAMIN WITH MINERALS) tablet Take 1 tablet by mouth daily.    . DULoxetine (CYMBALTA) 60 MG capsule Take 1 capsule (60 mg total) by mouth daily. (Patient not taking: Reported on 11/04/2017) 90 capsule 1   No current facility-administered medications on file prior to visit.     No Known Allergies   Objective:  BP 120/84   Pulse (!) 59   Temp 98.2 F (36.8 C) (Oral)   Resp 16   Ht  (1.676 m)   Wt 204 lb 12.8 oz (92.9 kg)   SpO2 98%   BMI 33.06 kg/m   Physical Exam  Constitutional: He is oriented to person, place, and time and well-developed, well-nourished, and in no distress. No distress.  Cardiovascular: Normal rate, regular rhythm and normal heart sounds.  Neurological: He is alert and oriented to person, place, and time. GCS score is 15.  Skin: Skin is warm and dry.  Psychiatric: Mood, memory, affect and judgment normal.  Vitals reviewed.   Assessment and Plan :  1. Anxiety state 2. Obsessive-compulsive disorder, unspecified type 3. Encounter for medication management - FLUoxetine (PROZAC) 20 MG tablet; Take 1 tablet (20 mg total) by mouth daily. May increase dose in 20 mg increments every 1-2 weeks based on response.  Dispense: 90 tablet; Refill: 0 - Pt is here today with his wife to discuss medication management of his mood disorder. Tried Cymbalta 5 7 months  ago, no improvement. He has been on Prozac in the past and felt good. Plan to try Prozac again - start 20mg  qd, titrate up to 40mg /daily if needed. RTC in 4-6 weeks for recheck. Consider Effexor      Marco CollieWhitney Jeancarlos Marchena, PA-C  Primary Care at [facility] 11/04/2017 8:25 AM

## 2017-11-04 NOTE — Patient Instructions (Addendum)
Prozac: Start taking 20 mg daily; you may increase dose gradually in 20 mg increments every 1 to 2 weeks based on response and tolerability. Max dose 80mg/day.  Come back and see me in 4-6 weeks to check in.   Thank you for coming in today. I hope you feel we met your needs.  Feel free to call PCP if you have any questions or further requests.  Please consider signing up for MyChart if you do not already have it, as this is a great way to communicate with me.  Best,  Whitney McVey, PA-C   IF you received an x-ray today, you will receive an invoice from [facility]. Please contact [facility] at [phone] with questions or concerns regarding your invoice.   IF you received labwork today, you will receive an invoice from [facility]. Please contact [facility] at [phone] with questions or concerns regarding your invoice.   Our billing staff will not be able to assist you with questions regarding bills from these companies.  You will be contacted with the lab results as soon as they are available. The fastest way to get your results is to activate your My Chart account. Instructions are located on the last page of this paperwork. If you have not heard from us regarding the results in 2 weeks, please contact this office.     

## 2017-12-17 ENCOUNTER — Other Ambulatory Visit: Payer: Self-pay

## 2017-12-17 ENCOUNTER — Encounter: Payer: Self-pay | Admitting: Physician Assistant

## 2017-12-17 ENCOUNTER — Ambulatory Visit (INDEPENDENT_AMBULATORY_CARE_PROVIDER_SITE_OTHER): Payer: 59 | Admitting: Physician Assistant

## 2017-12-17 VITALS — BP 118/72 | HR 61 | Temp 98.1°F | Resp 16 | Ht 65.5 in | Wt 190.6 lb

## 2017-12-17 DIAGNOSIS — F429 Obsessive-compulsive disorder, unspecified: Secondary | ICD-10-CM | POA: Insufficient documentation

## 2017-12-17 DIAGNOSIS — F411 Generalized anxiety disorder: Secondary | ICD-10-CM | POA: Diagnosis not present

## 2017-12-17 MED ORDER — FLUOXETINE HCL 40 MG PO CAPS
40.0000 mg | ORAL_CAPSULE | Freq: Every day | ORAL | 3 refills | Status: DC
Start: 2017-12-17 — End: 2018-04-07

## 2017-12-17 MED FILL — FLUoxetine HCL 40 MG CAPS: 40 | 30 days supply | Qty: 30 | Fill #0

## 2017-12-17 NOTE — Progress Notes (Signed)
   Kathreen CosierJames Rosten  MRN: [id number] DOB: 05/02/1976  PCP: Shade FloodGreene, Jeffrey R, MD  Subjective:  Pt is a 42 year old male who presents to clinic for f/u anxiety.  He was here 6 weeks ago for anxiety and OCD (see note).     Started Prozac 20mg  qd with instructions to titrate up to 40mg  qd as needed.  Today he is feeling great. Side effect of abdominal cramping and diarrhea for a week or so, but this has now resolved.  OCD is improving "still there but manageable". Anxiety has also improved. Feeling good on this dose.   He has tried: Prozac Cymbalta    Review of Systems  Gastrointestinal: Positive for abdominal pain and diarrhea. Negative for constipation, nausea and vomiting.  Psychiatric/Behavioral: Negative for behavioral problems and confusion. The patient is nervous/anxious.     There are no active problems to display for this patient.   Current Outpatient Medications on File Prior to Visit  Medication Sig Dispense Refill  . FLUoxetine (PROZAC) 20 MG tablet Take 1 tablet (20 mg total) by mouth daily. May increase dose in 20 mg increments every 1-2 weeks based on response. 90 tablet 0  . Multiple Vitamins-Minerals (MULTIVITAMIN WITH MINERALS) tablet Take 1 tablet by mouth daily.    . DULoxetine (CYMBALTA) 60 MG capsule Take 1 capsule (60 mg total) by mouth daily. (Patient not taking: Reported on 11/04/2017) 90 capsule 1   No current facility-administered medications on file prior to visit.     No Known Allergies   Objective:  BP 118/72   Pulse 61   Temp 98.1 F (36.7 C) (Oral)   Resp 16   Ht 5' 5.5" (1.664 m)   Wt 190 lb 9.6 oz (86.5 kg)   SpO2 98%   BMI 31.24 kg/m   Physical Exam  Constitutional: He is oriented to person, place, and time and well-developed, well-nourished, and in no distress. No distress.  Neurological: He is alert and oriented to person, place, and time. GCS score is 15.  Skin: Skin is warm and dry.  Psychiatric: Mood, memory, affect and judgment  normal.  Vitals reviewed.   Assessment and Plan :  1. Generalized anxiety disorder 2. Obsessive-compulsive disorder, unspecified type - FLUoxetine (PROZAC) 40 MG capsule; Take 1 capsule (40 mg total) by mouth daily.  Dispense: 30 capsule; Refill: 3 - Pt is feeling great with Prozac 40mg . OK to refill at this dose. He is in need of annual exam. Will see him soon for annual.   Marco CollieWhitney Tishawna Larouche, PA-C  Primary Care at [facility] 12/17/2017 9:23 AM

## 2017-12-17 NOTE — Patient Instructions (Signed)
     IF you received an x-ray today, you will receive an invoice from  [facility]. Please contact [facility] at [phone] with questions or concerns regarding your invoice.   IF you received labwork today, you will receive an invoice from [facility]. Please contact [facility] at [phone] with questions or concerns regarding your invoice.   Our billing staff will not be able to assist you with questions regarding bills from these companies.  You will be contacted with the lab results as soon as they are available. The fastest way to get your results is to activate your My Chart account. Instructions are located on the last page of this paperwork. If you have not heard from us regarding the results in 2 weeks, please contact this office.     

## 2017-12-23 ENCOUNTER — Ambulatory Visit (INDEPENDENT_AMBULATORY_CARE_PROVIDER_SITE_OTHER): Payer: 59 | Admitting: Physician Assistant

## 2017-12-23 ENCOUNTER — Encounter: Payer: Self-pay | Admitting: Physician Assistant

## 2017-12-23 VITALS — BP 122/72 | HR 55 | Temp 98.2°F | Resp 17 | Ht 66.0 in | Wt 190.0 lb

## 2017-12-23 DIAGNOSIS — Z130 Encounter for screening for diseases of the blood and blood-forming organs and certain disorders involving the immune mechanism: Secondary | ICD-10-CM | POA: Diagnosis not present

## 2017-12-23 DIAGNOSIS — Z1329 Encounter for screening for other suspected endocrine disorder: Secondary | ICD-10-CM

## 2017-12-23 DIAGNOSIS — Z0000 Encounter for general adult medical examination without abnormal findings: Secondary | ICD-10-CM | POA: Diagnosis not present

## 2017-12-23 DIAGNOSIS — Z13220 Encounter for screening for lipoid disorders: Secondary | ICD-10-CM | POA: Diagnosis not present

## 2017-12-23 LAB — POCT URINALYSIS DIP (MANUAL ENTRY)
Bilirubin, UA: NEGATIVE
Blood, UA: NEGATIVE
Glucose, UA: NEGATIVE mg/dL
Ketones, POC UA: NEGATIVE mg/dL
Leukocytes, UA: NEGATIVE
Nitrite, UA: NEGATIVE
Protein Ur, POC: NEGATIVE mg/dL
Spec Grav, UA: 1.02 (ref 1.010–1.025)
Urobilinogen, UA: 0.2 E.U./dL
pH, UA: 7 (ref 5.0–8.0)

## 2017-12-23 NOTE — Progress Notes (Signed)
Primary Care at [facility] [address] [address], [place] [address] [phone]  Date:  12/23/2017   Name:  Chase Nunez   DOB:  12/17/1975   MRN:  [id number]  PCP:  Greene, Jeffrey R, MD    Chief Complaint: Annual Exam   History of Present Illness:  This is a 41 y.o. male with PMH OCD and GAD who is presenting for CPE. 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  Last physical 2 years ago.   GAD and OCD well controlled with Prozac 40mg qd.   Complaints:  Back pain of right scapula x 6 years. Chronic. H/o shoulder surgery in the past.  Immunizations: UTD Dentist: q 6 months. Eye: 20/15 b/l. Diet: mostly healthy  Exercise: does rounds with professional MMA fighters twice weekly Fam hx: father heart disease, HTN Sexual hx: Intercourse with his wife only. married to director of radiology at [employer]. One 21 y.o. Son "he's a slacker"  Urinary hesitancy/frequency or nocturia:  none Problems with erectile dysfunction: none Tobacco/alcohol/substance use: never smoker, currently smokes smokeless tobacco and daily snuff. Drinks 10 alcoholic drinks/week, no drug use.   Review of Systems:  Review of Systems  Constitutional: Negative for activity change, appetite change, chills, diaphoresis and fatigue.  HENT: Negative for congestion, dental problem, sneezing and tinnitus.   Eyes: Negative for visual disturbance.  Respiratory: Negative for cough, chest tightness, shortness of breath and wheezing.   Cardiovascular: Negative for chest pain, palpitations and leg swelling.  Gastrointestinal: Negative for abdominal pain, blood in stool, constipation, diarrhea, nausea and vomiting.  Endocrine: Negative for polydipsia, polyphagia and polyuria.  Genitourinary: Negative for decreased urine volume, difficulty urinating, discharge, hematuria, scrotal swelling and testicular pain.  Musculoskeletal: Positive for back pain. Negative for arthralgias, neck pain and neck stiffness.   Allergic/Immunologic: Negative for environmental allergies and food allergies.  Neurological: Negative for dizziness, syncope, weakness, light-headedness and headaches.  Psychiatric/Behavioral: Negative for sleep disturbance. The patient is not nervous/anxious.     Patient Active Problem List   Diagnosis Date Noted  . Obsessive-compulsive disorder 12/17/2017  . Generalized anxiety disorder 12/17/2017    Prior to Admission medications   Medication Sig Start Date End Date Taking? Authorizing Provider  FLUoxetine (PROZAC) 40 MG capsule Take 1 capsule (40 mg total) by mouth daily. 12/17/17  Yes McVey, Elizabeth Whitney, PA-C  Multiple Vitamins-Minerals (MULTIVITAMIN WITH MINERALS) tablet Take 1 tablet by mouth daily.   Yes [provider]  Omega-3 Fatty Acids (OMEGA-3 EPA FISH OIL PO) Take by mouth.   Yes [provider]    No Known Allergies  Past Surgical History:  Procedure Laterality Date  . SHOULDER ARTHROSCOPY      Social History   Tobacco Use  . Smoking status: Never Smoker  . Smokeless tobacco: Current User    Types: Snuff  Substance Use Topics  . Alcohol use: Yes  . Drug use: No    Family History  Problem Relation Age of Onset  . Heart disease Mother   . Hyperlipidemia Mother   . Hypertension Mother     Medication list has been reviewed and updated.  Physical Examination:  Physical Exam  Constitutional: He is oriented to person, place, and time. He appears well-developed and well-nourished. No distress.  HENT:  Head: Normocephalic and atraumatic.  Right Ear: External ear normal.  Left Ear: External ear normal.  Nose: Nose normal.  Mouth/Throat: No oropharyngeal exudate.  Fibrocartilage overgrowth b/l ears     Eyes: Conjunctivae and EOM are normal. Pupils are equal, round, and reactive to light.  Neck: Normal range of motion. No thyromegaly present.  Cardiovascular: Normal rate, regular rhythm, normal heart sounds and intact distal  pulses.  No murmur heard. Pulmonary/Chest: Effort normal and breath sounds normal. No respiratory distress. He has no wheezes.  Abdominal: Soft. Bowel sounds are normal. He exhibits no distension and no mass. There is no tenderness.  Musculoskeletal: Normal range of motion. He exhibits no edema.  Lymphadenopathy:    He has no cervical adenopathy.  Neurological: He is alert and oriented to person, place, and time. He has normal reflexes.  Skin: Skin is warm and dry.  Psychiatric: He has a normal mood and affect. His behavior is normal. Judgment and thought content normal.  Vitals reviewed.   BP 122/72   Pulse (!) 55   Temp 98.2 F (36.8 C) (Oral)   Resp 17   Ht 5' 6" (1.676 m)   Wt 190 lb (86.2 kg)   SpO2 98%   BMI 30.67 kg/m   Assessment and Plan: 1. Annual physical exam - Pt presents for annual exam. H/o OCD and GAD well controlled with Prozac 40mg. Routine labs are pending. Anticipatory guidance provided.   2. Screening for endocrine disorder - CMP14+EGFR - POCT urinalysis dipstick  3. Screening, lipid - Lipid panel  4. Screening for deficiency anemia - CBC with Differential/Platelet  Whitney McVey, PA-C  Primary Care at [facility] 12/23/2017 9:11 AM   

## 2017-12-23 NOTE — Patient Instructions (Addendum)
For your back: Consider dry needling for your back in the future. Try using a tennis ball between your back and the wall to massage. Try a foam roller. Stay well hydrated. Use heating pad (moist heat works best for muscles - like hot wet towel).   We will contact you with the results of your labs.    Health Maintenance, Male A healthy lifestyle and preventive care is important for your health and wellness. Ask your health care provider about what schedule of regular examinations is right for you. What should I know about weight and diet? Eat a Healthy Diet  Eat plenty of vegetables, fruits, whole grains, low-fat dairy products, and lean protein.  Do not eat a lot of foods high in solid fats, added sugars, or salt.  Maintain a Healthy Weight Regular exercise can help you achieve or maintain a healthy weight. You should:  Do at least 150 minutes of exercise each week. The exercise should increase your heart rate and make you sweat (moderate-intensity exercise).  Do strength-training exercises at least twice a week.  Watch Your Levels of Cholesterol and Blood Lipids  Have your blood tested for lipids and cholesterol every 5 years starting at 40 years of age. If you are at high risk for heart disease, you should start having your blood tested when you are 40 years old. You may need to have your cholesterol levels checked more often if: ? Your lipid or cholesterol levels are high. ? You are older than 40 years of age. ? You are at high risk for heart disease.  What should I know about cancer screening? Many types of cancers can be detected early and may often be prevented. Lung Cancer  You should be screened every year for lung cancer if: ? You are a current smoker who has smoked for at least 30 years. ? You are a former smoker who has quit within the past 15 years.  Talk to your health care provider about your screening options, when you should start screening, and how often you  should be screened.  Colorectal Cancer  Routine colorectal cancer screening usually begins at 40 years of age and should be repeated every 5-10 years until you are 40 years old. You may need to be screened more often if early forms of precancerous polyps or small growths are found. Your health care provider may recommend screening at an earlier age if you have risk factors for colon cancer.  Your health care provider may recommend using home test kits to check for hidden blood in the stool.  A small camera at the end of a tube can be used to examine your colon (sigmoidoscopy or colonoscopy). This checks for the earliest forms of colorectal cancer.  Prostate and Testicular Cancer  Depending on your age and overall health, your health care provider may do certain tests to screen for prostate and testicular cancer.  Talk to your health care provider about any symptoms or concerns you have about testicular or prostate cancer.  Skin Cancer  Check your skin from head to toe regularly.  Tell your health care provider about any new moles or changes in moles, especially if: ? There is a change in a mole's size, shape, or color. ? You have a mole that is larger than a pencil eraser.  Always use sunscreen. Apply sunscreen liberally and repeat throughout the day.  Protect yourself by wearing long sleeves, pants, a wide-brimmed hat, and sunglasses when outside.  What should  I know about heart disease, diabetes, and high blood pressure?  If you are 47-66 years of age, have your blood pressure checked every 3-5 years. If you are 68 years of age or older, have your blood pressure checked every year. You should have your blood pressure measured twice-once when you are at a hospital or clinic, and once when you are not at a hospital or clinic. Record the average of the two measurements. To check your blood pressure when you are not at a hospital or clinic, you can use: ? An automated blood pressure  machine at a pharmacy. ? A home blood pressure monitor.  Talk to your health care provider about your target blood pressure.  If you are between 21-37 years old, ask your health care provider if you should take aspirin to prevent heart disease.  Have regular diabetes screenings by checking your fasting blood sugar level. ? If you are at a normal weight and have a low risk for diabetes, have this test once every three years after the age of 36. ? If you are overweight and have a high risk for diabetes, consider being tested at a younger age or more often.  A one-time screening for abdominal aortic aneurysm (AAA) by ultrasound is recommended for men aged 65-75 years who are current or former smokers. What should I know about preventing infection? Hepatitis B If you have a higher risk for hepatitis B, you should be screened for this virus. Talk with your health care provider to find out if you are at risk for hepatitis B infection. Hepatitis C Blood testing is recommended for:  Everyone born from [birth year] through 1963.  Anyone with known risk factors for hepatitis C.  Sexually Transmitted Diseases (STDs)  You should be screened each year for STDs including gonorrhea and chlamydia if: ? You are sexually active and are younger than 40 years of age. ? You are older than 40 years of age and your health care provider tells you that you are at risk for this type of infection. ? Your sexual activity has changed since you were last screened and you are at an increased risk for chlamydia or gonorrhea. Ask your health care provider if you are at risk.  Talk with your health care provider about whether you are at high risk of being infected with HIV. Your health care provider may recommend a prescription medicine to help prevent HIV infection.  What else can I do?  Schedule regular health, dental, and eye exams.  Stay current with your vaccines (immunizations).  Do not use any tobacco products,  such as cigarettes, chewing tobacco, and e-cigarettes. If you need help quitting, ask your health care provider.  Limit alcohol intake to no more than 2 drinks per day. One drink equals 12 ounces of beer, 5 ounces of wine, or 1 ounces of hard liquor.  Do not use street drugs.  Do not share needles.  Ask your health care provider for help if you need support or information about quitting drugs.  Tell your health care provider if you often feel depressed.  Tell your health care provider if you have ever been abused or do not feel safe at home. This information is not intended to replace advice given to you by your health care provider. Make sure you discuss any questions you have with your health care provider. Document Released: 05/23/2008 Document Revised: 07/24/2016 Document Reviewed: 08/29/2015 Elsevier Interactive Patient Education  2018 [employer].  IF you received  an x-ray today, you will receive an invoice from [facility]. Please contact [facility] at [phone] with questions or concerns regarding your invoice.   IF you received labwork today, you will receive an invoice from [facility]. Please contact [facility] at [phone] with questions or concerns regarding your invoice.   Our billing staff will not be able to assist you with questions regarding bills from these companies.  You will be contacted with the lab results as soon as they are available. The fastest way to get your results is to activate your My Chart account. Instructions are located on the last page of this paperwork. If you have not heard from [place] regarding the results in 2 weeks, please contact this office.

## 2017-12-24 ENCOUNTER — Encounter: Payer: Self-pay | Admitting: Physician Assistant

## 2017-12-24 LAB — CBC WITH DIFFERENTIAL/PLATELET
Basophils Absolute: 0 10*3/uL (ref 0.0–0.2)
Basos: 0 %
EOS (ABSOLUTE): 0.1 10*3/uL (ref 0.0–0.4)
Eos: 1 %
Hematocrit: 45.2 % (ref 37.5–51.0)
Hemoglobin: 15.9 g/dL (ref 13.0–17.7)
Immature Grans (Abs): 0 10*3/uL (ref 0.0–0.1)
Immature Granulocytes: 0 %
Lymphocytes Absolute: 1.4 10*3/uL (ref 0.7–3.1)
Lymphs: 29 %
MCH: 31 pg (ref 26.6–33.0)
MCHC: 35.2 g/dL (ref 31.5–35.7)
MCV: 88 fL (ref 79–97)
Monocytes Absolute: 0.4 10*3/uL (ref 0.1–0.9)
Monocytes: 9 %
Neutrophils Absolute: 2.8 10*3/uL (ref 1.4–7.0)
Neutrophils: 61 %
Platelets: 223 10*3/uL (ref 150–379)
RBC: 5.13 x10E6/uL (ref 4.14–5.80)
RDW: 13.1 % (ref 12.3–15.4)
WBC: 4.7 10*3/uL (ref 3.4–10.8)

## 2017-12-24 LAB — LIPID PANEL
Chol/HDL Ratio: 3.1 ratio (ref 0.0–5.0)
Cholesterol, Total: 181 mg/dL (ref 100–199)
HDL: 58 mg/dL (ref >39)
LDL Calculated: 113 mg/dL — ABNORMAL HIGH (ref 0–99)
Triglycerides: 49 mg/dL (ref 0–149)
VLDL Cholesterol Cal: 10 mg/dL (ref 5–40)

## 2017-12-24 LAB — CMP14+EGFR
ALT: 14 IU/L (ref 0–44)
AST: 17 IU/L (ref 0–40)
Albumin/Globulin Ratio: 1.9 (ref 1.2–2.2)
Albumin: 4.5 g/dL (ref 3.5–5.5)
Alkaline Phosphatase: 76 IU/L (ref 39–117)
BUN/Creatinine Ratio: 11 (ref 9–20)
BUN: 12 mg/dL (ref 6–20)
Bilirubin Total: 0.5 mg/dL (ref 0.0–1.2)
CO2: 21 mmol/L (ref 20–29)
Calcium: 9.6 mg/dL (ref 8.7–10.2)
Chloride: 101 mmol/L (ref 96–106)
Creatinine, Ser: 1.1 mg/dL (ref 0.76–1.27)
GFR calc Af Amer: 97 mL/min/{1.73_m2} (ref >59)
GFR calc non Af Amer: 84 mL/min/{1.73_m2} (ref >59)
Globulin, Total: 2.4 g/dL (ref 1.5–4.5)
Glucose: 94 mg/dL (ref 65–99)
Potassium: 4.3 mmol/L (ref 3.5–5.2)
Sodium: 140 mmol/L (ref 134–144)
Total Protein: 6.9 g/dL (ref 6.0–8.5)

## 2018-01-19 ENCOUNTER — Encounter: Payer: Self-pay | Admitting: Physician Assistant

## 2018-01-19 ENCOUNTER — Ambulatory Visit (INDEPENDENT_AMBULATORY_CARE_PROVIDER_SITE_OTHER): Payer: 59 | Admitting: Physician Assistant

## 2018-01-19 ENCOUNTER — Other Ambulatory Visit: Payer: Self-pay

## 2018-01-19 VITALS — BP 128/86 | HR 98 | Temp 98.0°F | Resp 18 | Ht 67.13 in | Wt 183.0 lb

## 2018-01-19 DIAGNOSIS — R0981 Nasal congestion: Secondary | ICD-10-CM | POA: Diagnosis not present

## 2018-01-19 DIAGNOSIS — J069 Acute upper respiratory infection, unspecified: Secondary | ICD-10-CM | POA: Diagnosis not present

## 2018-01-19 DIAGNOSIS — J029 Acute pharyngitis, unspecified: Secondary | ICD-10-CM

## 2018-01-19 DIAGNOSIS — J3489 Other specified disorders of nose and nasal sinuses: Secondary | ICD-10-CM

## 2018-01-19 LAB — POC INFLUENZA A&B (BINAX/QUICKVUE)
Influenza A, POC: NEGATIVE
Influenza B, POC: NEGATIVE

## 2018-01-19 MED ORDER — AZELASTINE HCL 0.1 % NA SOLN: 2.0000 | Freq: Two times a day (BID) | NASAL | 0 refills | Status: DC

## 2018-01-19 MED FILL — AZELASTINE 0.1% (137 MCG) S: 0.1 | 30 days supply | Qty: 30 | Fill #0

## 2018-01-19 MED FILL — FLUoxetine HCL 40 MG CAPS: 40 | 30 days supply | Qty: 30 | Fill #1

## 2018-01-19 NOTE — Patient Instructions (Addendum)
- We will treat this as a respiratory viral infection.  - I recommend you rest, drink plenty of fluids, eat light meals including soups.  -You may continue with Sudafed daily.  I also recommend over-the-counter ibuprofen 600 mg every 6-8 hours.  I have given you a prescription for new nasal spray to use instead of Afrin.  I also recommend over-the-counter nasal saline rinses daily. - You may also use Tylenol or ibuprofen over-the-counter for your sore throat.  - Please let me know if you are not seeing any improvement or get worse in 5-7 days.  Sinusitis, Adult Sinusitis is soreness and inflammation of your sinuses. Sinuses are hollow spaces in the bones around your face. They are located:  Around your eyes.  In the middle of your forehead.  Behind your nose.  In your cheekbones.  Your sinuses and nasal passages are lined with a stringy fluid (mucus). Mucus normally drains out of your sinuses. When your nasal tissues get inflamed or swollen, the mucus can get trapped or blocked so air cannot flow through your sinuses. This lets bacteria, viruses, and funguses grow, and that leads to infection. Follow these instructions at home: Medicines  Take, use, or apply over-the-counter and prescription medicines only as told by your doctor. These may include nasal sprays.  If you were prescribed an antibiotic medicine, take it as told by your doctor. Do not stop taking the antibiotic even if you start to feel better. Hydrate and Humidify  Drink enough water to keep your pee (urine) clear or pale yellow.  Use a cool mist humidifier to keep the humidity level in your home above 50%.  Breathe in steam for 10-15 minutes, 3-4 times a day or as told by your doctor. You can do this in the bathroom while a hot shower is running.  Try not to spend time in cool or dry air. Rest  Rest as much as possible.  Sleep with your head raised (elevated).  Make sure to get enough sleep each night. General  instructions  Put a warm, moist washcloth on your face 3-4 times a day or as told by your doctor. This will help with discomfort.  Wash your hands often with soap and water. If there is no soap and water, use hand sanitizer.  Do not smoke. Avoid being around people who are smoking (secondhand smoke).  Keep all follow-up visits as told by your doctor. This is important. Contact a doctor if:  You have a fever.  Your symptoms get worse.  Your symptoms do not get better within 10 days. Get help right away if:  You have a very bad headache.  You cannot stop throwing up (vomiting).  You have pain or swelling around your face or eyes.  You have trouble seeing.  You feel confused.  Your neck is stiff.  You have trouble breathing. This information is not intended to replace advice given to you by your health care provider. Make sure you discuss any questions you have with your health care provider. Document Released: 05/13/2008 Document Revised: 07/21/2016 Document Reviewed: 09/20/2015 Elsevier Interactive Patient Education  2018 [employer]     IF you received an x-ray today, you will receive an invoice [facility]. Please contact [facility] at [phone] with questions or concerns regarding your invoice.   IF you received labwork today, you will receive an invoice from [facility]. Please contact [facility] at [phone] with questions or concerns regarding your invoice.   Our billing staff  will not be able to assist you with questions regarding bills from these companies.  You will be contacted with the lab results as soon as they are available. The fastest way to get your results is to activate your My Chart account. Instructions are located on the last page of this paperwork. If you have not heard from [place] regarding the results in 2 weeks, please contact this office.

## 2018-01-19 NOTE — Progress Notes (Signed)
MRN: [id number] DOB: 12/29/1975  Subjective:   Chase Nunez is a 42 y.o. male presenting for chief complaint of Nasal Congestion (X 3 days- pt states with sore throat  and runny nose off and on) .  Reports 3 day history of sore throat, nasal congestin, and sinus pressure.  Notes he has had some body aches too. Has tried sudafed and afrin with relief. Denies fever, cough, wheezing, shortness of breath and chest pain, chills, nausea, vomiting, abdominal pain and diarrhea. Has not had sick contact with anyone. No history of seasonal allergies. Patient has not had flu shot this season.  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  Denies smoking. Denies any other aggravating or relieving factors, no other questions or concerns.  Noelle has a current medication list which includes the following prescription(s): fluoxetine, multivitamin with minerals, omega-3 fatty acids, pseudoephedrine, and azelastine. Also has No Known Allergies.  Chase Nunez  has a past medical history of Arthritis and Depression. Also  has a past surgical history that includes Shoulder arthroscopy.   Objective:   Vitals: BP 128/86 (BP Location: Right Arm, Patient Position: Sitting, Cuff Size: Normal)   Pulse 98   Temp 98 F (36.7 C) (Oral)   Resp 18   Ht 5' 7.13" (1.705 m)   Wt 183 lb (83 kg)   SpO2 98%   BMI 28.55 kg/m   Physical Exam  Constitutional: He is oriented to person, place, and time. He appears well-developed and well-nourished. No distress.  HENT:  Head: Normocephalic and atraumatic.  Right Ear: External ear and ear canal normal. Tympanic membrane is not erythematous and not bulging. A middle ear effusion is present.  Left Ear: External ear and ear canal normal. Tympanic membrane is not erythematous and not bulging. A middle ear effusion is present.  Nose: Mucosal edema present. Right sinus exhibits no maxillary sinus tenderness and  no frontal sinus tenderness. Left sinus exhibits no maxillary sinus tenderness and no frontal sinus tenderness.  Mouth/Throat: Uvula is midline and mucous membranes are normal. Posterior oropharyngeal erythema present. Tonsils are 1+ on the right. Tonsils are 1+ on the left. No tonsillar exudate.  Eyes: Conjunctivae are normal.  Neck: Normal range of motion.  Pulmonary/Chest: Effort normal and breath sounds normal. He has no wheezes. He has no rhonchi. He has no rales.  Lymphadenopathy:       Head (right side): No submental, no submandibular, no tonsillar, no preauricular, no posterior auricular and no occipital adenopathy present.       Head (left side): No submental, no submandibular, no tonsillar, no preauricular, no posterior auricular and no occipital adenopathy present.    He has no cervical adenopathy.       Right: No supraclavicular adenopathy present.       Left: No supraclavicular adenopathy present.  Neurological: He is alert and oriented to person, place, and time.  Skin: Skin is warm and dry.  Psychiatric: He has a normal mood and affect.  Vitals reviewed.   Results for orders placed or performed in visit on 01/19/18 (from the past 24 hour(s))  POC Influenza A&B(BINAX/QUICKVUE)     Status: Normal   Collection Time: 01/19/18  4:18 PM  Result Value Ref Range   Influenza A, POC Negative Negative   Influenza B, POC Negative Negative    Assessment and Plan :  1. Nasal congestion - POC Influenza A&B(BINAX/QUICKVUE) -  azelastine (ASTELIN) 0.1 % nasal spray; Place 2 sprays into both nostrils 2 (two) times daily. Use in each nostril as directed  Dispense: 30 mL; Refill: 0 2. Sinus pressure 3. Sore throat 4. Acute upper respiratory infection History and physical exam findings consistent with viral etiology.  Point-of-care flu test negative.  Vital stable. Patient is well-appearing, no distress.  Recommended continuing with symptomatic treatment at this time.  Recommended continuing  Sudafed and discontinuing Afrin at this time.  Given Rx for  Astelin nasal spray. Also recommended over-the-counter ibuprofen and nasal saline rinses. Advised to return to clinic if symptoms worsen, do not improve in 5-7 days, or as needed.  Benjiman CoreBrittany Ediberto Sens, PA-C  [facility] at [facility] 01/19/2018 7:02 PM

## 2018-02-19 MED FILL — FLUoxetine HCL 40 MG CAPS: 40 | 30 days supply | Qty: 30 | Fill #2

## 2018-03-25 MED FILL — FLUoxetine HCL 40 MG CAPS: 40 | 30 days supply | Qty: 30 | Fill #3

## 2018-04-07 ENCOUNTER — Other Ambulatory Visit: Payer: Self-pay

## 2018-04-07 ENCOUNTER — Ambulatory Visit (INDEPENDENT_AMBULATORY_CARE_PROVIDER_SITE_OTHER): Payer: 59

## 2018-04-07 ENCOUNTER — Encounter: Payer: Self-pay | Admitting: Physician Assistant

## 2018-04-07 ENCOUNTER — Ambulatory Visit: Payer: 59 | Admitting: Physician Assistant

## 2018-04-07 VITALS — BP 152/80 | HR 60 | Temp 98.0°F | Resp 16 | Ht 66.54 in | Wt 185.0 lb

## 2018-04-07 DIAGNOSIS — M25532 Pain in left wrist: Secondary | ICD-10-CM

## 2018-04-07 DIAGNOSIS — F411 Generalized anxiety disorder: Secondary | ICD-10-CM

## 2018-04-07 MED ORDER — FLUOXETINE HCL 40 MG PO CAPS: 40.0000 mg | ORAL_CAPSULE | Freq: Every day | ORAL | 3 refills | Status: DC

## 2018-04-07 MED ORDER — MELOXICAM 15 MG PO TABS: 15.0000 mg | ORAL_TABLET | Freq: Every day | ORAL | 0 refills | Status: DC

## 2018-04-07 MED FILL — MELOXICAM 15 MG TABLET: 15 | 30 days supply | Qty: 30 | Fill #0

## 2018-04-07 NOTE — Patient Instructions (Addendum)
Your x-ray looks great.  You will receive a phone call to schedule an appointment with ortho.  Continue wearing your wrist brace while active during the day. You may wear it while sleeping if needed.   Meloxicam is an NSAID. Take this once daily for your wrist pain. Do not use with any other otc pain medication other than tylenol/acetaminophen - so no aleve, ibuprofen, motrin, advil, etc.   Meloxicam tablets What is this medicine? MELOXICAM (mel OX i cam) is a non-steroidal anti-inflammatory drug (NSAID). It is used to reduce swelling and to treat pain. It may be used for osteoarthritis, rheumatoid arthritis, or juvenile rheumatoid arthritis. This medicine may be used for other purposes; ask your health care provider or pharmacist if you have questions. COMMON BRAND NAME(S): Mobic What should I tell my health care provider before I take this medicine? They need to know if you have any of these conditions: -bleeding disorders -cigarette smoker -coronary artery bypass graft (CABG) surgery within the past 2 weeks -drink more than 3 alcohol-containing drinks per day -heart disease -high blood pressure -history of stomach bleeding -kidney disease -liver disease -lung or breathing disease, like asthma -stomach or intestine problems -an unusual or allergic reaction to meloxicam, aspirin, other NSAIDs, other medicines, foods, dyes, or preservatives -pregnant or trying to get pregnant -breast-feeding How should I use this medicine? Take this medicine by mouth with a full glass of water. Follow the directions on the prescription label. You can take it with or without food. If it upsets your stomach, take it with food. Take your medicine at regular intervals. Do not take it more often than directed. Do not stop taking except on your doctor's advice. A special MedGuide will be given to you by the pharmacist with each prescription and refill. Be sure to read this information carefully each  time. Talk to your pediatrician regarding the use of this medicine in children. While this drug may be prescribed for selected conditions, precautions do apply. Patients over 51 years old may have a stronger reaction and need a smaller dose. Overdosage: If you think you have taken too much of this medicine contact a poison control center or emergency room at once. NOTE: This medicine is only for you. Do not share this medicine with others. What if I miss a dose? If you miss a dose, take it as soon as you can. If it is almost time for your next dose, take only that dose. Do not take double or extra doses. What may interact with this medicine? Do not take this medicine with any of the following medications: -cidofovir -ketorolac This medicine may also interact with the following medications: -aspirin and aspirin-like medicines -certain medicines for blood pressure, heart disease, irregular heart beat -certain medicines for depression, anxiety, or psychotic disturbances -certain medicines that treat or prevent blood clots like warfarin, enoxaparin, dalteparin, apixaban, dabigatran, rivaroxaban -cyclosporine -digoxin -diuretics -methotrexate -other NSAIDs, medicines for pain and inflammation, like ibuprofen and naproxen -pemetrexed This list may not describe all possible interactions. Give your health care provider a list of all the medicines, herbs, non-prescription drugs, or dietary supplements you use. Also tell them if you smoke, drink alcohol, or use illegal drugs. Some items may interact with your medicine. What should I watch for while using this medicine? Tell your doctor or healthcare professional if your symptoms do not start to get better or if they get worse. Do not take other medicines that contain aspirin, ibuprofen, or naproxen with this medicine.  Side effects such as stomach upset, nausea, or ulcers may be more likely to occur. Many medicines available without a prescription  should not be taken with this medicine. This medicine can cause ulcers and bleeding in the stomach and intestines at any time during treatment. This can happen with no warning and may cause death. There is increased risk with taking this medicine for a long time. Smoking, drinking alcohol, older age, and poor health can also increase risks. Call your doctor right away if you have stomach pain or blood in your vomit or stool. This medicine does not prevent heart attack or stroke. In fact, this medicine may increase the chance of a heart attack or stroke. The chance may increase with longer use of this medicine and in people who have heart disease. If you take aspirin to prevent heart attack or stroke, talk with your doctor or health care professional. What side effects may I notice from receiving this medicine? Side effects that you should report to your doctor or health care professional as soon as possible: -allergic reactions like skin rash, itching or hives, swelling of the face, lips, or tongue -nausea, vomiting -signs and symptoms of a blood clot such as breathing problems; changes in vision; chest pain; severe, sudden headache; pain, swelling, warmth in the leg; trouble speaking; sudden numbness or weakness of the face, arm, or leg -signs and symptoms of bleeding such as bloody or black, tarry stools; red or dark-brown urine; spitting up blood or brown material that looks like coffee grounds; red spots on the skin; unusual bruising or bleeding from the eye, gums, or nose -signs and symptoms of liver injury like dark yellow or brown urine; general ill feeling or flu-like symptoms; light-colored stools; loss of appetite; nausea; right upper belly pain; unusually weak or tired; yellowing of the eyes or skin -signs and symptoms of stroke like changes in vision; confusion; trouble speaking or understanding; severe headaches; sudden numbness or weakness of the face, arm, or leg; trouble walking; dizziness;  loss of balance or coordination Side effects that usually do not require medical attention (report to your doctor or health care professional if they continue or are bothersome): -constipation -diarrhea -gas This list may not describe all possible side effects. Call your doctor for medical advice about side effects. You may report side effects to FDA at 1-800-FDA-1088. Where should I keep my medicine? Keep out of the reach of children. Store at room temperature between 15 and 30 degrees C (59 and 86 degrees F). Throw away any unused medicine after the expiration date. NOTE: This sheet is a summary. It may not cover all possible information. If you have questions about this medicine, talk to your doctor, pharmacist, or health care provider.  2018 Elsevier/Gold Standard (2015-12-27 19:28:16)  Thank you for coming in today. I hope you feel we met your needs.  Feel free to call PCP if you have any questions or further requests.  Please consider signing up for MyChart if you do not already have it, as this is a great way to communicate with me.  Best,  Whitney McVey, PA-C  IF you received an x-ray today, you will receive an invoice from [facility]. Please contact [facility] at [phone] with questions or concerns regarding your invoice.   IF you received labwork today, you will receive an invoice from [place]. Please contact [facility] at [phone] with questions or concerns regarding your invoice.   Our billing staff will not be able to assist  you with questions regarding bills from these companies.  You will be contacted with the lab results as soon as they are available. The fastest way to get your results is to activate your My Chart account. Instructions are located on the last page of this paperwork. If you have not heard from [place] regarding the results in 2 weeks, please contact this office.

## 2018-04-07 NOTE — Progress Notes (Signed)
Chase Nunez  MRN: [id number] DOB: 06-May-1976  PCP: Shade Flood, MD  Subjective:  Pt is a pleasant 42 year old male who presents to clinic for worsening left wrist pain x about 6 months.  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 His wrist did not hurt very much after the initial injury, however pain has been worsening the past few weeks. Pain is located at his rist on the pinky side. Pain is 2/10 at rest and 7/10 with movement.  Pain is worse with supination against resistance and pulling motions. Driving his car is becoming more painful. Pain radiates up the outside of his arm. Denies redness, swelling, numbness, tingling. He has not taken anything for pain. He recently bought a wrist brace at [employer] - this has been helping some.  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   GAD - he is almost out of Prozac  and would like a refill. Anxiety is mostly well controlled on this dose, "I have my occasional bad days". Denies HI or SI.   Review of Systems  Musculoskeletal: Positive for arthralgias (left wrist). Negative for joint swelling.  Skin: Negative.   Neurological: Negative for weakness and numbness.    Patient Active Problem List   Diagnosis Date Noted  . Obsessive-compulsive disorder 12/17/2017  . Generalized anxiety disorder 12/17/2017    Current Outpatient Medications on File Prior to Visit  Medication Sig Dispense Refill  . FLUoxetine (PROZAC) 40 MG capsule Take 1 capsule (40 mg total) by mouth daily. 30 capsule 3  . Multiple Vitamins-Minerals (MULTIVITAMIN WITH MINERALS) tablet Take 1 tablet by mouth daily.    . Omega-3 Fatty Acids (OMEGA-3 EPA FISH OIL PO) Take by mouth.     No current facility-administered medications on file prior to visit.     No Known Allergies   Objective:  BP (!) 152/80   Pulse 60   Temp 98 F (36.7 C) (Oral)   Resp 16   Ht 5' 6.54" (1.69 m)   Wt 185  lb (83.9 kg)   SpO2 99%   BMI 29.38 kg/m   Physical Exam  Constitutional: He is oriented to person, place, and time. He appears well-developed and well-nourished.  Pulmonary/Chest: Effort normal. No respiratory distress.  Musculoskeletal:       Left wrist: He exhibits tenderness. He exhibits normal range of motion, no bony tenderness, no swelling, no effusion and no deformity.  Generalized TTP medial wrist. No deformity. Pain with supination and pronation against resistance.    Neurological: He is alert and oriented to person, place, and time.  Skin: Skin is warm and dry.  Psychiatric: He has a normal mood and affect. His behavior is normal. Judgment and thought content normal.  Vitals reviewed.   Dg Wrist Complete Left  Result Date: 04/07/2018 CLINICAL DATA:  Worsening left wrist pain for 6 months. Fall on outstretched hand. EXAM: LEFT WRIST - COMPLETE 3+ VIEW COMPARISON:  None. FINDINGS: There is no evidence of fracture or dislocation. There is no evidence of arthropathy or other focal bone abnormality. Soft tissues are unremarkable. IMPRESSION: Negative. Electronically Signed   By: Charlett Nose M.D.   On: 04/07/2018 09:56    Assessment and Plan :  1. Left wrist pain - DG Wrist Complete Left; Future - Ambulatory referral to Orthopedic Surgery - meloxicam (MOBIC) 15 MG tablet; Take 1 tablet (15 mg total) by mouth daily.  Dispense: 30 tablet; Refill: 0 - Pt presents with worsening left wrist pain x several months. Today's x-ray is negative. Wrist brace applied to left wrist. Advices PRICE principles. Plan to refer to ortho for further evaluation due to increasing pain with ADLs.  2. Generalized anxiety disorder - FLUoxetine (PROZAC) 40 MG capsule; Take 1 capsule (40 mg total) by mouth daily.  Dispense: 30 capsule; Refill: 3 - Pt is doing well. OK to refill at this dose.   Marco Collie, PA-C  Primary Care at [facility] 04/07/2018 9:37 AM

## 2018-04-20 ENCOUNTER — Encounter (INDEPENDENT_AMBULATORY_CARE_PROVIDER_SITE_OTHER): Payer: Self-pay | Admitting: Orthopaedic Surgery

## 2018-04-20 ENCOUNTER — Ambulatory Visit (INDEPENDENT_AMBULATORY_CARE_PROVIDER_SITE_OTHER): Payer: 59 | Admitting: Orthopaedic Surgery

## 2018-04-20 VITALS — BP 125/80 | HR 61 | Resp 16 | Ht 66.0 in | Wt 185.0 lb

## 2018-04-20 DIAGNOSIS — M25531 Pain in right wrist: Secondary | ICD-10-CM | POA: Diagnosis not present

## 2018-04-20 MED ORDER — LIDOCAINE HCL (PF) 1 % IJ SOLN
1.0000 mL | INTRAMUSCULAR | Status: AC | PRN
  Administered 2018-04-20: 1 mL

## 2018-04-20 MED ORDER — METHYLPREDNISOLONE ACETATE 40 MG/ML IJ SUSP
30.0000 mg | INTRAMUSCULAR | Status: AC | PRN
Start: 2018-04-20 — End: 2018-04-20
  Administered 2018-04-20: 30 mg via INTRA_ARTICULAR

## 2018-04-20 MED ORDER — BUPIVACAINE HCL 0.5 % IJ SOLN
1.0000 mL | INTRAMUSCULAR | Status: AC | PRN
  Administered 2018-04-20: 1 mL via INTRA_ARTICULAR

## 2018-04-20 NOTE — Progress Notes (Signed)
Office Visit Note   Patient: Chase Nunez           Date of Birth: 1976/09/27           MRN: [id number] Visit Date: 04/20/2018              Requested by: Sebastian Ache, PA-C [id number] [place], [place] [address] PCP: Sebastian Ache, PA-C   Assessment & Plan: Visit Diagnoses:  1. Pain in right wrist     Plan: 46-month history of left nondominant wrist pain.  Pain is localized over the TFCC.  Will inject that area and monitor course over several weeks.  If still uncomfortable obtain an MR arthrogram left wrist  Follow-Up Instructions: Return if symptoms worsen or fail to improve.   Orders:  Orders Placed This Encounter  Procedures  . Medium Joint Inj: L radiocarpal   No orders of the defined types were placed in this encounter.     Procedures: Medium Joint Inj: L radiocarpal on 04/20/2018 8:50 AM Medications: 1 mL lidocaine (PF) 1 %; 1 mL bupivacaine 0.5 %; 30 mg methylPREDNISolone acetate 40 MG/ML      Clinical Data: No additional findings.   Subjective: Chief Complaint  Patient presents with  . Left Wrist - Injury  . New Patient (Initial Visit)    INJURY DURING [facility] AND MARTIAL ARTS IN SEPT 2018, TRIED MELOXICAM FROM [facility] AND SENT HERE  First noted onset of left nondominant wrist pain around Labor Day of 2018.  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  No specific injury or trauma.  Recently he had an exacerbation without further trauma.  Seen by his family physician and placed on Mobic and a wrist splint.  Still feeling better.  Does not have any numbness or tingling but rather pain with certain positions.  Has not had any numbness tingling ecchymosis or erythema.  It is localized in the area of the distal ulna. X-rays were obtained on April 30 per his primary care physician.  These were reviewed.  I did not see any specific abnormality.  No ectopic calcification or evidence of fracture.  Slight ulnar  positive variance.  No increased  distance with the intercarpal area  HPI  Review of Systems  Constitutional: Negative for fatigue and fever.  HENT: Negative for ear pain.   Eyes: Negative for pain.  Respiratory: Negative for cough and shortness of breath.   Cardiovascular: Negative for leg swelling.  Gastrointestinal: Negative for constipation and diarrhea.  Genitourinary: Negative for difficulty urinating.  Musculoskeletal: Negative for back pain and neck pain.  Skin: Negative for rash.  Allergic/Immunologic: Negative for food allergies.  Neurological: Positive for numbness. Negative for weakness.  Hematological: Does not bruise/bleed easily.  Psychiatric/Behavioral: Negative for sleep disturbance.     Objective: Vital Signs: BP 125/80 (BP Location: Left Arm, Patient Position: Sitting, Cuff Size: Normal)   Pulse 61   Resp 16   Ht  (1.676 m)   Wt 185 lb (83.9 kg)   BMI 29.86 kg/m   Physical Exam  Constitutional: He is oriented to person, place, and time. He appears well-developed and well-nourished.  HENT:  Mouth/Throat: Oropharynx is clear and moist.  Eyes: Pupils are equal, round, and reactive to light. EOM are normal.  Pulmonary/Chest: Effort normal.  Neurological: He is alert and oriented to person, place, and time.  Skin: Skin is warm and dry.  Psychiatric: He has a normal mood and affect. His behavior is normal.  Ortho Exam awake alert and oriented x3.  Comfortable sitting.  Examination of the left wrist without erythema or ecchymosis.  No Tinel's over the good grip and good release.  Normal opposition of thumb to little finger.  Does have some tenderness in the area of the TFCC to palpation and to radial and ulnar deviation.  No basilar thumb pain.  Good capillary refill.  Neurovascular exam intact  Specialty Comments:  No specialty comments available.  Imaging: No results found.   PMFS History: Patient Active Problem List   Diagnosis Date Noted  . Obsessive-compulsive disorder  12/17/2017  . Generalized anxiety disorder 12/17/2017   Past Medical History:  Diagnosis Date  . Arthritis   . Depression     Family History  Problem Relation Age of Onset  . Heart disease Father   . Hypertension Father   . Heart disease Maternal Grandfather   . Hyperlipidemia Maternal Grandfather   . Diabetes Paternal Grandmother   . Heart disease Paternal Grandfather   . Diabetes Paternal Grandfather   . Hyperlipidemia Paternal Grandfather     Past Surgical History:  Procedure Laterality Date  . SHOULDER ARTHROSCOPY     Social History   Occupational History  . Not on file  Tobacco Use  . Smoking status: Never Smoker  . Smokeless tobacco: Current User    Types: Snuff  Substance and Sexual Activity  . Alcohol use: Yes    Comment: occ  . Drug use: No  . Sexual activity: Yes

## 2018-04-23 MED FILL — FLUoxetine HCL 40 MG CAPS: 40 | 30 days supply | Qty: 30 | Fill #0

## 2018-05-26 MED FILL — FLUoxetine HCL 40 MG CAPS: 40 | 30 days supply | Qty: 30 | Fill #1

## 2018-06-24 MED FILL — FLUoxetine HCL 40 MG CAPS: 40 | 30 days supply | Qty: 30 | Fill #2

## 2018-07-20 MED FILL — FLUoxetine HCL 40 MG CAPS: 40 | 30 days supply | Qty: 30 | Fill #3

## 2018-08-18 ENCOUNTER — Ambulatory Visit (INDEPENDENT_AMBULATORY_CARE_PROVIDER_SITE_OTHER): Payer: 59 | Admitting: Physician Assistant

## 2018-08-18 ENCOUNTER — Ambulatory Visit: Payer: Self-pay | Admitting: Physician Assistant

## 2018-08-18 ENCOUNTER — Other Ambulatory Visit: Payer: Self-pay

## 2018-08-18 ENCOUNTER — Encounter: Payer: Self-pay | Admitting: Physician Assistant

## 2018-08-18 VITALS — BP 125/81 | HR 51 | Temp 98.0°F | Resp 16 | Ht 65.5 in | Wt 189.2 lb

## 2018-08-18 DIAGNOSIS — H8110 Benign paroxysmal vertigo, unspecified ear: Secondary | ICD-10-CM | POA: Diagnosis not present

## 2018-08-18 MED ORDER — MECLIZINE HCL 25 MG PO TABS: 25.0000 mg | ORAL_TABLET | Freq: Three times a day (TID) | ORAL | 0 refills | Status: DC | PRN

## 2018-08-18 MED FILL — MECLIZINE 25 MG TABLET: 25 | 10 days supply | Qty: 30 | Fill #0

## 2018-08-18 NOTE — Telephone Encounter (Signed)
Called in c/o vertigo that started last Tuesday.   He woke up with it feeling like he was falling.   See triage notes.  Agent scheduled him for today at 12:00 with [facility].   Reason for Disposition . [1] MODERATE dizziness (e.g., vertigo; feels very unsteady, interferes with normal activities) AND [2] has NOT been evaluated by physician for this  Answer Assessment - Initial Assessment Questions 1. DESCRIPTION: "Describe your dizziness."     The room is spinning.  When I close my eyes it makes it better.  Started last Tuesday.   Woke up from a sleep like I was falling. 2. VERTIGO: "Do you feel like either you or the room is spinning or tilting?"      Yes 3. LIGHTHEADED: "Do you feel lightheaded?" (e.g., somewhat faint, woozy, weak upon standing)     No 4. SEVERITY: "How bad is it?"  "Can you walk?"   - MILD - Feels unsteady but walking normally.   - MODERATE - Feels very unsteady when walking, but not falling; interferes with normal activities (e.g., school, work) .   - SEVERE - Unable to walk without falling (requires assistance).     It comes and goes with head movement.   I ok with walking and doing daily things.   At work I have to get up and down and that's where it's getting me. 5. ONSET:  "When did the dizziness begin?"     Last Tuesday. 6. AGGRAVATING FACTORS: "Does anything make it worse?" (e.g., standing, change in head position)     Head movement 7. CAUSE: "What do you think is causing the dizziness?"     I thought it was a ear infection.   I tried OTC ear drops which did not change anything.   No other symptoms. 8. RECURRENT SYMPTOM: "Have you had dizziness before?" If so, ask: "When was the last time?" "What happened that time?"     Yes on a cruise in August felt it for a day or two. 9. OTHER SYMPTOMS: "Do you have any other symptoms?" (e.g., headache, weakness, numbness, vomiting, earache)     No other symptoms 10. PREGNANCY: "Is there any chance you are  pregnant?" "When was your last menstrual period?"       N/A  Protocols used: DIZZINESS - VERTIGO-A-AH

## 2018-08-18 NOTE — Progress Notes (Signed)
Chase Nunez  MRN: [id number] DOB: 08-Jan-1976  PCP: Sebastian Ache, PA-C  Subjective:  Pt is a 42 year old male who presents to clinic for vertigo.   He went on a cruise one month ago. Felt dizzy on the first day of cruise. Symptoms continued when he got off the boat "standing still and feeling kind of wobbly". This went away.   Woke up one week ago "felt like I was in a dream and was falling" and the room was still spinning. This feeling happens if he is rolling or move his head.  One episode of looking up while doing something and and felt like the room was spinning.  Doesn't happen with standing from seated position.  The week before it started he had allergies.  He has tried otc ear drops, did not help.  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   Denies n/v, HA, confusion, lightheadedness.   Review of Systems  Constitutional: Negative for diaphoresis and fatigue.  Gastrointestinal: Negative for nausea and vomiting.  Neurological: Positive for dizziness. Negative for light-headedness and headaches.    Patient Active Problem List   Diagnosis Date Noted  . Obsessive-compulsive disorder 12/17/2017  . Generalized anxiety disorder 12/17/2017    Current Outpatient Medications on File Prior to Visit  Medication Sig Dispense Refill  . FLUoxetine (PROZAC) 40 MG capsule Take 1 capsule (40 mg total) by mouth daily. 30 capsule 3  . Multiple Vitamins-Minerals (MULTIVITAMIN WITH MINERALS) tablet Take 1 tablet by mouth daily.    . Omega-3 Fatty Acids (OMEGA-3 EPA FISH OIL PO) Take by mouth.    . meloxicam (MOBIC) 15 MG tablet Take 1 tablet (15 mg total) by mouth daily. (Patient not taking: Reported on 08/18/2018) 30 tablet 0   No current facility-administered medications on file prior to visit.     No Known Allergies   Objective:  BP 125/81 (BP Location: Right Arm, Patient Position: Sitting, Cuff Size: Large)   Pulse (!) 51   Temp 98 F (36.7 C) (Oral)    Resp 16   Ht 5' 5.5" (1.664 m)   Wt 189 lb 3.2 oz (85.8 kg)   SpO2 98%   BMI 31.01 kg/m   Orthostatic VS for the past 24 hrs:  BP- Lying Pulse- Lying BP- Sitting Pulse- Sitting BP- Standing at 0 minutes Pulse- Standing at 0 minutes  08/18/18 1228 134/89 61 125/81 51 133/78 59    Physical Exam  Constitutional: He is oriented to person, place, and time. He appears well-developed and well-nourished.  Eyes: Pupils are equal, round, and reactive to light. EOM are normal.  Neurological: He is alert and oriented to person, place, and time.  No nystagmus with Epley maneuver. "I feel buzzed" when leaning back with head tilt 45 degrees left.  Mild improvement of symptoms with Dix-Hallpike maneuver.   Skin: Skin is warm and dry.  Psychiatric: He has a normal mood and affect. His behavior is normal. Judgment and thought content normal.  Vitals reviewed.  Assessment and Plan :  1. Benign paroxysmal positional vertigo, unspecified laterality -Patient presents complaining of dizziness with head movement.  No red flags. HPI suggestive of BPPV.  Patient has MMA competition in 2 weeks.  Plan to refer to physical therapy for treatment.  Meclizine as needed.  Return to clinic in 3 to 4 weeks if no improvement - meclizine (ANTIVERT) 25 MG tablet; Take 1 tablet (25 mg total) by mouth 3 (three)  times daily as needed for dizziness.  Dispense: 30 tablet; Refill: 0 - Ambulatory referral to Physical Therapy    Marco Collie, PA-C  Primary Care at [facility] 08/18/2018 12:52 PM  Please note: Portions of this report may have been transcribed using dragon voice recognition software. Every effort was made to ensure accuracy; however, inadvertent computerized transcription errors may be present.

## 2018-08-18 NOTE — Patient Instructions (Addendum)
You will receive a phone call to schedule an appointment with physical therapy ([facility], [phone]).  Meclizine: Oral: 25 to 100 mg/day in 3 or 4 divided doses as needed (so that would be '25mg'$  three times/day as needed)   Benign Positional Vertigo Vertigo is the feeling that you or your surroundings are moving when they are not. Benign positional vertigo is the most common form of vertigo. The cause of this condition is not serious (is benign). This condition is triggered by certain movements and positions (is positional). This condition can be dangerous if it occurs while you are doing something that could endanger you or others, such as driving. What are the causes? In many cases, the cause of this condition is not known. It may be caused by a disturbance in an area of the inner ear that helps your brain to sense movement and balance. This disturbance can be caused by a viral infection (labyrinthitis), head injury, or repetitive motion. What increases the risk? This condition is more likely to develop in:  Women.  People who are 40 years of age or older.  What are the signs or symptoms? Symptoms of this condition usually happen when you move your head or your eyes in different directions. Symptoms may start suddenly, and they usually last for less than a minute. Symptoms may include:  Loss of balance and falling.  Feeling like you are spinning or moving.  Feeling like your surroundings are spinning or moving.  Nausea and vomiting.  Blurred vision.  Dizziness.  Involuntary eye movement (nystagmus).  Symptoms can be mild and cause only slight annoyance, or they can be severe and interfere with daily life. Episodes of benign positional vertigo may return (recur) over time, and they may be triggered by certain movements. Symptoms may improve over time. How is this diagnosed? This condition is usually diagnosed by medical history and a physical exam of the head, neck,  and ears. You may be referred to a health care provider who specializes in ear, nose, and throat (ENT) problems (otolaryngologist) or a provider who specializes in disorders of the nervous system (neurologist). You may have additional testing, including:  MRI.  A CT scan.  Eye movement tests. Your health care provider may ask you to change positions quickly while he or she watches you for symptoms of benign positional vertigo, such as nystagmus. Eye movement may be tested with an electronystagmogram (ENG), caloric stimulation, the Dix-Hallpike test, or the roll test.  An electroencephalogram (EEG). This records electrical activity in your brain.  Hearing tests.  How is this treated? Usually, your health care provider will treat this by moving your head in specific positions to adjust your inner ear back to normal. Surgery may be needed in severe cases, but this is rare. In some cases, benign positional vertigo may resolve on its own in 2-4 weeks. Follow these instructions at home: Safety  Move slowly.Avoid sudden body or head movements.  Avoid driving.  Avoid operating heavy machinery.  Avoid doing any tasks that would be dangerous to you or others if a vertigo episode would occur.  If you have trouble walking or keeping your balance, try using a cane for stability. If you feel dizzy or unstable, sit down right away.  Return to your normal activities as told by your health care provider. Ask your health care provider what activities are safe for you. General instructions  Take over-the-counter and prescription medicines only as told by your health care provider.  Avoid certain positions or movements as told by your health care provider.  Drink enough fluid to keep your urine clear or pale yellow.  Keep all follow-up visits as told by your health care provider. This is important. Contact a health care provider if:  You have a fever.  Your condition gets worse or you develop  new symptoms.  Your family or friends notice any behavioral changes.  Your nausea or vomiting gets worse.  You have numbness or a "pins and needles" sensation. Get help right away if:  You have difficulty speaking or moving.  You are always dizzy.  You faint.  You develop severe headaches.  You have weakness in your legs or arms.  You have changes in your hearing or vision.  You develop a stiff neck.  You develop sensitivity to light. This information is not intended to replace advice given to you by your health care provider. Make sure you discuss any questions you have with your health care provider. Document Released: 09/02/2006 Document Revised: 05/02/2016 Document Reviewed: 03/20/2015 Elsevier Interactive Patient Education  [employer].  Thank you for coming in today. I hope you feel we met your needs.  Feel free to call PCP if you have any questions or further requests.  Please consider signing up for MyChart if you do not already have it, as this is a great way to communicate with me.  Best,  Whitney McVey, PA-C  IF you received an x-ray today, you will receive an invoice from [facility]. Please contact [facility] at [phone] with questions or concerns regarding your invoice.   IF you received labwork today, you will receive an invoice from [facility]. Please contact [facility] at [phone] with questions or concerns regarding your invoice.   Our billing staff will not be able to assist you with questions regarding bills from these companies.  You will be contacted with the lab results as soon as they are available. The fastest way to get your results is to activate your My Chart account. Instructions are located on the last page of this paperwork. If you have not heard from [place] regarding the results in 2 weeks, please contact this office.

## 2018-08-26 DIAGNOSIS — H8110 Benign paroxysmal vertigo, unspecified ear: Secondary | ICD-10-CM | POA: Diagnosis not present

## 2018-08-26 DIAGNOSIS — M542 Cervicalgia: Secondary | ICD-10-CM | POA: Diagnosis not present

## 2018-08-27 ENCOUNTER — Other Ambulatory Visit: Payer: Self-pay | Admitting: Physician Assistant

## 2018-08-27 DIAGNOSIS — F411 Generalized anxiety disorder: Secondary | ICD-10-CM

## 2018-08-27 MED FILL — FLUoxetine HCL 40 MG CAPS: 40 | 30 days supply | Qty: 30 | Fill #0

## 2018-08-27 NOTE — Telephone Encounter (Signed)
Fluoxetine HCl 40 mg  refill Last Refill:04/07/18 # 30 and three refills Last OV: 08/18/18 PCP: Bea LauraE. McVey Pharmacy: [facility]

## 2018-09-25 MED FILL — FLUoxetine HCL 40 MG CAPS: 40 | 30 days supply | Qty: 30 | Fill #1

## 2018-10-23 MED FILL — FLUoxetine HCL 40 MG CAPS: 40 | 30 days supply | Qty: 30 | Fill #2

## 2018-11-23 MED FILL — FLUoxetine HCL 40 MG CAPS: 40 | 30 days supply | Qty: 30 | Fill #3

## 2018-12-24 ENCOUNTER — Other Ambulatory Visit: Payer: Self-pay | Admitting: Physician Assistant

## 2018-12-24 DIAGNOSIS — F411 Generalized anxiety disorder: Secondary | ICD-10-CM

## 2018-12-24 MED ORDER — FLUOXETINE HCL 40 MG PO CAPS: 40.0000 mg | ORAL_CAPSULE | Freq: Every day | ORAL | 0 refills | Status: DC

## 2018-12-24 MED FILL — FLUoxetine HCL 40 MG CAPS: 40 | 90 days supply | Qty: 90 | Fill #0

## 2018-12-24 NOTE — Telephone Encounter (Signed)
Copied from CRM [id number]. Topic: Quick Communication - Rx Refill/Question >> Dec 24, 2018  4:02 PM Baldo Daub L wrote: Medication: FLUoxetine (PROZAC) 40 MG capsule  Has the patient contacted their pharmacy? Yes - states they haven't heard from [place] yet (Agent: If no, request that the patient contact the pharmacy for the refill.) (Agent: If yes, when and what did the pharmacy advise?)  Preferred Pharmacy (with phone number or street name): [facility] - [place], [place] - [address] [place] [phone] (Phone) [phone] (Fax)  Agent: Please be advised that RX refills may take up to 3 business days. We ask that you follow-up with your pharmacy.

## 2018-12-24 NOTE — Telephone Encounter (Signed)
Patient called and advised he will need to establish with a new provider and his physical is due. He says that he is now on his wife's insurance and he will need to consult with her about that, because he doesn't know who her insurance will be with doctor wise. I advised a refill will be granted to cover until that decision is made, he verbalized understanding.

## 2019-03-02 ENCOUNTER — Telehealth: Payer: Self-pay | Admitting: *Deleted

## 2019-03-02 NOTE — Telephone Encounter (Signed)
Questions for Screening COVID-19 Pt called to do screening prior to appt on Friday 3/27 with Samantha.  Symptom onset: None  Travel or Contacts: No travel, no exposure that he is aware of. Pt's wife works at [employer].  During this illness, did/does the patient experience any of the following symptoms? Fever >100.44F []   Yes [x]   No []   Unknown Subjective fever (felt feverish) []   Yes [x]   No []   Unknown Chills []   Yes [x]   No []   Unknown Muscle aches (myalgia) []   Yes [x]   No []   Unknown Runny nose (rhinorrhea) []   Yes [x]   No []   Unknown Sore throat []   Yes [x]   No []   Unknown Cough (new onset or worsening of chronic cough) []   Yes [x]   No []   Unknown Shortness of breath (dyspnea) []   Yes [x]   No []   Unknown Nausea or vomiting []   Yes [x]   No []   Unknown Headache []   Yes [x]   No []   Unknown Abdominal pain  []   Yes [x]   No []   Unknown Diarrhea (?3 loose/looser than normal stools/24hr period) []   Yes [x]   No []   Unknown Other, specify:  Patient risk factors: Smoker? []   Current [x]   Former []   Never If male, currently pregnant? []   Yes []   No  N/A  Patient Active Problem List   Diagnosis Date Noted  . Obsessive-compulsive disorder 12/17/2017  . Generalized anxiety disorder 12/17/2017    Plan:  []   High risk for COVID-19 with red flags go to ED (with CP, SOB, weak/lightheaded, or fever > 101.5). Call ahead.  []   High risk for COVID-19 but stable will have car visit. Inform provider and coordinate time. Will be completed in afternoon. *Consider myChart, telephone, or WebEx visit if available at site. []   No red flags but URI signs or symptoms will go through side door and be seen in dedicated room.  Note: Referral to telemedicine is an appropriate alternative disposition for higher risk but stable. [facility]/e-Visit: [phone].

## 2019-03-05 ENCOUNTER — Ambulatory Visit: Payer: 59 | Admitting: Physician Assistant

## 2019-03-05 ENCOUNTER — Other Ambulatory Visit: Payer: Self-pay

## 2019-03-05 ENCOUNTER — Encounter: Payer: Self-pay | Admitting: Physician Assistant

## 2019-03-05 VITALS — BP 118/80 | HR 55 | Temp 98.8°F | Ht 66.0 in | Wt 188.0 lb

## 2019-03-05 DIAGNOSIS — Z0000 Encounter for general adult medical examination without abnormal findings: Secondary | ICD-10-CM | POA: Diagnosis not present

## 2019-03-05 DIAGNOSIS — Z789 Other specified health status: Secondary | ICD-10-CM | POA: Diagnosis not present

## 2019-03-05 DIAGNOSIS — F341 Dysthymic disorder: Secondary | ICD-10-CM | POA: Diagnosis not present

## 2019-03-05 DIAGNOSIS — F32A Depression, unspecified: Secondary | ICD-10-CM | POA: Insufficient documentation

## 2019-03-05 DIAGNOSIS — F329 Major depressive disorder, single episode, unspecified: Secondary | ICD-10-CM | POA: Insufficient documentation

## 2019-03-05 DIAGNOSIS — Z13220 Encounter for screening for lipoid disorders: Secondary | ICD-10-CM

## 2019-03-05 DIAGNOSIS — Z136 Encounter for screening for cardiovascular disorders: Secondary | ICD-10-CM

## 2019-03-05 DIAGNOSIS — Z720 Tobacco use: Secondary | ICD-10-CM

## 2019-03-05 DIAGNOSIS — F411 Generalized anxiety disorder: Secondary | ICD-10-CM | POA: Diagnosis not present

## 2019-03-05 DIAGNOSIS — F419 Anxiety disorder, unspecified: Secondary | ICD-10-CM | POA: Insufficient documentation

## 2019-03-05 LAB — COMPREHENSIVE METABOLIC PANEL
ALT: 16 U/L (ref 0–53)
AST: 18 U/L (ref 0–37)
Albumin: 4.6 g/dL (ref 3.5–5.2)
Alkaline Phosphatase: 71 U/L (ref 39–117)
BUN: 16 mg/dL (ref 6–23)
CO2: 28 mEq/L (ref 19–32)
Calcium: 9.6 mg/dL (ref 8.4–10.5)
Chloride: 102 mEq/L (ref 96–112)
Creatinine, Ser: 0.98 mg/dL (ref 0.40–1.50)
GFR: 84.4 mL/min (ref >60.00)
Glucose, Bld: 89 mg/dL (ref 70–99)
Potassium: 4.4 mEq/L (ref 3.5–5.1)
Sodium: 139 mEq/L (ref 135–145)
Total Bilirubin: 0.6 mg/dL (ref 0.2–1.2)
Total Protein: 7.1 g/dL (ref 6.0–8.3)

## 2019-03-05 LAB — CBC WITH DIFFERENTIAL/PLATELET
Basophils Absolute: 0 10*3/uL (ref 0.0–0.1)
Basophils Relative: 0.5 % (ref 0.0–3.0)
Eosinophils Absolute: 0.1 10*3/uL (ref 0.0–0.7)
Eosinophils Relative: 0.9 % (ref 0.0–5.0)
HCT: 48 % (ref 39.0–52.0)
Hemoglobin: 16.7 g/dL (ref 13.0–17.0)
Lymphocytes Relative: 24.8 % (ref 12.0–46.0)
Lymphs Abs: 1.5 10*3/uL (ref 0.7–4.0)
MCHC: 34.7 g/dL (ref 30.0–36.0)
MCV: 89.6 fl (ref 78.0–100.0)
Monocytes Absolute: 0.5 10*3/uL (ref 0.1–1.0)
Monocytes Relative: 8.5 % (ref 3.0–12.0)
Neutro Abs: 4 10*3/uL (ref 1.4–7.7)
Neutrophils Relative %: 65.3 % (ref 43.0–77.0)
Platelets: 212 10*3/uL (ref 150.0–400.0)
RBC: 5.36 Mil/uL (ref 4.22–5.81)
RDW: 12.9 % (ref 11.5–15.5)
WBC: 6.1 10*3/uL (ref 4.0–10.5)

## 2019-03-05 LAB — LIPID PANEL
Cholesterol: 214 mg/dL — ABNORMAL HIGH (ref 0–200)
HDL: 75.7 mg/dL (ref >39.00)
LDL Cholesterol: 129 mg/dL — ABNORMAL HIGH (ref 0–99)
NonHDL: 138.64
Total CHOL/HDL Ratio: 3
Triglycerides: 48 mg/dL (ref 0.0–149.0)
VLDL: 9.6 mg/dL (ref 0.0–40.0)

## 2019-03-05 MED ORDER — FLUOXETINE HCL 40 MG PO CAPS: 40.0000 mg | ORAL_CAPSULE | Freq: Every day | ORAL | 3 refills | Status: DC

## 2019-03-05 NOTE — Progress Notes (Signed)
Chase Nunez is a 43 y.o. male here to Establish Care.  I acted as a Neurosurgeon for [employer], PA-C Corky Mull, LPN  History of Present Illness:   Chief Complaint  Patient presents with  . Establish Care  . Annual Exam    Acute Concerns: None  Chronic Issues: OCD tendencies/depression/anxiety -- has been on Prozac 40 mg daily for several years. Feels stable on this. Alcohol use -- drinks at least 3 drinks daily currently. Denies concerns regarding excessive intake presently.  Tobacco abuse -- currently using snuff.  Health Maintenance: Immunizations -- UTD, decline Flu Colonoscopy -- N/A Mammogram -- N/A PAP -- N/A Bone Density -- N/A Diet -- "crappy", tries to eat healthy when he goes out to eat Caffeine intake -- 2 cups daily Sleep habits -- now that his gym is closed he has now started sleeping in more and more Exercise -- wrestling as able Weight -- Weight: 188 lb (85.3 kg) -- normally around 175 lb Mood -- overall okay per patient Alcohol -- see above Tobacco -- snuff  Depression screen PHQ 2/9 03/05/2019  Decreased Interest 1  Down, Depressed, Hopeless 1  PHQ - 2 Score 2  Altered sleeping 1  Tired, decreased energy 1  Change in appetite 2  Feeling bad or failure about yourself  2  Trouble concentrating 2  Moving slowly or fidgety/restless 2  Suicidal thoughts 0  PHQ-9 Score 12  Difficult doing work/chores Not difficult at all    GAD 7 : Generalized Anxiety Score 03/05/2019  Nervous, Anxious, on Edge 1  Control/stop worrying 0  Worry too much - different things 1  Trouble relaxing 1  Restless 1  Easily annoyed or irritable 2  Afraid - awful might happen 0  Total GAD 7 Score 6  Anxiety Difficulty Not difficult at all     Other providers/specialists: Patient Care Team: Jarold Motto, [place] as PCP - General (Physician Assistant)   Past Medical History:  Diagnosis Date  . Alcohol consuption of more than two drinks per day   . Anxiety   .  Arthritis   . Depression   . H/O drug abuse (HCC)    none per patient since age 54  . History of chicken pox      Social History   Socioeconomic History  . Marital status: Married    Spouse name: Archie Patten  . Number of children: 1  . Years of education: Not on file  . Highest education level: Not on file  Occupational History  . Not on file  Social Needs  . Financial resource strain: Not on file  . Food insecurity:    Worry: Not on file    Inability: Not on file  . Transportation needs:    Medical: Not on file    Non-medical: Not on file  Tobacco Use  . Smoking status: Never Smoker  . Smokeless tobacco: Current User    Types: Snuff  Substance and Sexual Activity  . Alcohol use: Yes    Alcohol/week: 10.0 standard drinks    Types: 10 Shots of liquor per week  . Drug use: No  . Sexual activity: Yes  Lifestyle  . Physical activity:    Days per week: Not on file    Minutes per session: Not on file  . Stress: Not on file  Relationships  . Social connections:    Talks on phone: Not on file    Gets together: Not on file    Attends religious service:  Not on file    Active member of club or organization: Not on file    Attends meetings of clubs or organizations: Not on file    Relationship status: Not on file  . Intimate partner violence:    Fear of current or ex partner: Not on file    Emotionally abused: Not on file    Physically abused: Not on file    Forced sexual activity: Not on file  Other Topics Concern  . 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 SHOULDER ARTHROSCOPY      Family History  Problem Relation Age of Onset  . Thyroid disease Mother   . Birth defects Mother   . Miscarriages / [place] Mother   . Heart disease Father   . Hypertension Father   . Alcohol abuse Father   . Arthritis Father   . Drug  abuse Father   . High Cholesterol Father   . Heart disease Maternal Grandfather   . Hyperlipidemia Maternal Grandfather   . COPD Maternal Grandfather   . Diabetes Paternal Grandmother   . Heart disease Paternal Grandmother   . Kidney disease Paternal Grandmother   . Heart disease Paternal Grandfather   . Diabetes Paternal Grandfather   . Hyperlipidemia Paternal Grandfather   . COPD Maternal Grandmother   . Colon cancer Neg Hx   . Prostate cancer Neg Hx     No Known Allergies   Current Medications:   Current Outpatient Medications:  .  FLUoxetine (PROZAC) 40 MG capsule, Take 1 capsule (40 mg total) by mouth daily., Disp: 90 capsule, Rfl: 3 .  ibuprofen (ADVIL,MOTRIN) 200 MG tablet, Take 600-800 mg by mouth as needed., Disp: , Rfl:  .  Multiple Vitamins-Minerals (MULTIVITAMIN WITH MINERALS) tablet, Take 1 tablet by mouth daily., Disp: , Rfl:    Review of Systems:   Review of Systems  Constitutional: Negative.  Negative for chills, fever, malaise/fatigue and weight loss.  HENT: Negative.  Negative for hearing loss, sinus pain and sore throat.   Eyes: Negative.  Negative for blurred vision.  Respiratory: Negative.  Negative for cough and shortness of breath.   Cardiovascular: Negative.  Negative for chest pain, palpitations and leg swelling.  Gastrointestinal: Negative.  Negative for abdominal pain, constipation, diarrhea, heartburn, nausea and vomiting.  Genitourinary: Negative.  Negative for dysuria, frequency and urgency.  Musculoskeletal: Negative.  Negative for back pain, myalgias and neck pain.  Skin: Negative.  Negative for itching and rash.  Neurological: Negative.  Negative for dizziness, tingling, seizures, loss of consciousness and headaches.  Endo/Heme/Allergies: Negative.  Negative for polydipsia.  Psychiatric/Behavioral: Negative.  Negative for depression. The patient is not nervous/anxious.     Vitals:   Vitals:   03/05/19 1025  BP: 118/80  Pulse: (!) 55   Temp: 98.8 F (37.1 C)  TempSrc: Oral  SpO2: 98%  Weight: 188 lb (85.3 kg)  Height: 5\' 6"  (1.676 m)      Body mass index is 30.34 kg/m.  Physical Exam:   Physical Exam Vitals signs and nursing note reviewed.  Constitutional:      General: He is not in acute distress.    Appearance: Normal appearance. He is well-developed. He is not ill-appearing or toxic-appearing.  HENT:     Head: Normocephalic and atraumatic.     Right  Ear: Tympanic membrane normal. Tympanic membrane is not erythematous, retracted or bulging.     Left Ear: Tympanic membrane normal. Tympanic membrane is not erythematous, retracted or bulging.     Nose: Nose normal.     Mouth/Throat:     Pharynx: Uvula midline.  Eyes:     General: Lids are normal.     Conjunctiva/sclera: Conjunctivae normal.     Pupils: Pupils are equal, round, and reactive to light.  Neck:     Trachea: Trachea normal.  Cardiovascular:     Rate and Rhythm: Normal rate and regular rhythm.     Pulses: Normal pulses.     Heart sounds: Normal heart sounds, S1 normal and S2 normal.  Pulmonary:     Effort: Pulmonary effort is normal.     Breath sounds: Normal breath sounds.  Abdominal:     General: Bowel sounds are normal.     Tenderness: There is no abdominal tenderness.  Lymphadenopathy:     Cervical: No cervical adenopathy.  Skin:    General: Skin is warm and dry.  Neurological:     Mental Status: He is alert.  Psychiatric:        Speech: Speech normal.        Behavior: Behavior normal. Behavior is cooperative.        Thought Content: Thought content normal.       Assessment and Plan:   [name] was seen today for establish care and annual exam.  Diagnoses and all orders for this visit:  Routine physical examination Today patient counseled on age appropriate routine health concerns for screening and prevention, each reviewed and up to date or declined. Immunizations reviewed and up to date or declined. Labs ordered and  reviewed. Risk factors for depression reviewed and negative. Hearing function and visual acuity are intact. ADLs screened and addressed as needed. Functional ability and level of safety reviewed and appropriate. Education, counseling and referrals performed based on assessed risks today. Patient provided with a copy of personalized plan for preventive services. -     CBC with Differential/Platelet -     Comprehensive metabolic panel  Generalized anxiety disorder; Dysthymia Continue Prozac 40 mg daily. -     CBC with Differential/Platelet -     Comprehensive metabolic panel -     FLUoxetine (PROZAC) 40 MG capsule; Take 1 capsule (40 mg total) by mouth daily.  Encounter for lipid screening for cardiovascular disease -     Lipid panel  Alcohol consuption of more than two drinks per day Encouraged reduction.  Snuff user Encouraged reduction.  . Reviewed expectations re: course of current medical issues. . Discussed self-management of symptoms. . Outlined signs and symptoms indicating need for more acute intervention. . Patient verbalized understanding and all questions were answered. . See orders for this visit as documented in the electronic medical record. . Patient received an After-Visit Summary.  CMA or LPN served as scribe during this visit. History, Physical, and Plan performed by medical provider. The above documentation has been reviewed and is accurate and complete.  Jarold MottoSamantha Love Chowning, PA-C

## 2019-03-05 NOTE — Patient Instructions (Signed)
It was great to see you!  Please go to the lab for blood work.   Our office will call you with your results unless you have chosen to receive results via MyChart.  If your blood work is normal we will follow-up each year for physicals and as scheduled for chronic medical problems.  If anything is abnormal we will treat accordingly and get you in for a follow-up.  Take care,  Izola Teague    Health Maintenance, Male A healthy lifestyle and preventive care is important for your health and wellness. Ask your health care provider about what schedule of regular examinations is right for you. What should I know about weight and diet? Eat a Healthy Diet  Eat plenty of vegetables, fruits, whole grains, low-fat dairy products, and lean protein.  Do not eat a lot of foods high in solid fats, added sugars, or salt.  Maintain a Healthy Weight Regular exercise can help you achieve or maintain a healthy weight. You should:  Do at least 150 minutes of exercise each week. The exercise should increase your heart rate and make you sweat (moderate-intensity exercise).  Do strength-training exercises at least twice a week. Watch Your Levels of Cholesterol and Blood Lipids  Have your blood tested for lipids and cholesterol every 5 years starting at 41 years of age. If you are at high risk for heart disease, you should start having your blood tested when you are 41 years old. You may need to have your cholesterol levels checked more often if: ? Your lipid or cholesterol levels are high. ? You are older than 41 years of age. ? You are at high risk for heart disease. What should I know about cancer screening? Many types of cancers can be detected early and may often be prevented. Lung Cancer  You should be screened every year for lung cancer if: ? You are a current smoker who has smoked for at least 30 years. ? You are a former smoker who has quit within the past 15 years.  Talk to your health care  provider about your screening options, when you should start screening, and how often you should be screened. Colorectal Cancer  Routine colorectal cancer screening usually begins at 41 years of age and should be repeated every 5-10 years until you are 41 years old. You may need to be screened more often if early forms of precancerous polyps or small growths are found. Your health care provider may recommend screening at an earlier age if you have risk factors for colon cancer.  Your health care provider may recommend using home test kits to check for hidden blood in the stool.  A small camera at the end of a tube can be used to examine your colon (sigmoidoscopy or colonoscopy). This checks for the earliest forms of colorectal cancer. Prostate and Testicular Cancer  Depending on your age and overall health, your health care provider may do certain tests to screen for prostate and testicular cancer.  Talk to your health care provider about any symptoms or concerns you have about testicular or prostate cancer. Skin Cancer  Check your skin from head to toe regularly.  Tell your health care provider about any new moles or changes in moles, especially if: ? There is a change in a mole's size, shape, or color. ? You have a mole that is larger than a pencil eraser.  Always use sunscreen. Apply sunscreen liberally and repeat throughout the day.  Protect yourself by wearing   long sleeves, pants, a wide-brimmed hat, and sunglasses when outside. What should I know about heart disease, diabetes, and high blood pressure?  If you are 18-39 years of age, have your blood pressure checked every 3-5 years. If you are 40 years of age or older, have your blood pressure checked every year. You should have your blood pressure measured twice-once when you are at a hospital or clinic, and once when you are not at a hospital or clinic. Record the average of the two measurements. To check your blood pressure when you  are not at a hospital or clinic, you can use: ? An automated blood pressure machine at a pharmacy. ? A home blood pressure monitor.  Talk to your health care provider about your target blood pressure.  If you are between 45-79 years old, ask your health care provider if you should take aspirin to prevent heart disease.  Have regular diabetes screenings by checking your fasting blood sugar level. ? If you are at a normal weight and have a low risk for diabetes, have this test once every three years after the age of 45. ? If you are overweight and have a high risk for diabetes, consider being tested at a younger age or more often.  A one-time screening for abdominal aortic aneurysm (AAA) by ultrasound is recommended for men aged 65-75 years who are current or former smokers. What should I know about preventing infection? Hepatitis B If you have a higher risk for hepatitis B, you should be screened for this virus. Talk with your health care provider to find out if you are at risk for hepatitis B infection. Hepatitis C Blood testing is recommended for:  Everyone born from 1945 through 1965.  Anyone with known risk factors for hepatitis C. Sexually Transmitted Diseases (STDs)  You should be screened each year for STDs including gonorrhea and chlamydia if: ? You are sexually active and are younger than 41 years of age. ? You are older than 41 years of age and your health care provider tells you that you are at risk for this type of infection. ? Your sexual activity has changed since you were last screened and you are at an increased risk for chlamydia or gonorrhea. Ask your health care provider if you are at risk.  Talk with your health care provider about whether you are at high risk of being infected with HIV. Your health care provider may recommend a prescription medicine to help prevent HIV infection. What else can I do?  Schedule regular health, dental, and eye exams.  Stay current  with your vaccines (immunizations).  Do not use any tobacco products, such as cigarettes, chewing tobacco, and e-cigarettes. If you need help quitting, ask your health care provider.  Limit alcohol intake to no more than 2 drinks per day. One drink equals 12 ounces of beer, 5 ounces of wine, or 1 ounces of hard liquor.  Do not use street drugs.  Do not share needles.  Ask your health care provider for help if you need support or information about quitting drugs.  Tell your health care provider if you often feel depressed.  Tell your health care provider if you have ever been abused or do not feel safe at home. This information is not intended to replace advice given to you by your health care provider. Make sure you discuss any questions you have with your health care provider. Document Released: 05/23/2008 Document Revised: 07/24/2016 Document Reviewed: 08/29/2015 Elsevier Interactive   Patient Education  2019 [employer]  

## 2019-03-11 MED FILL — FLUoxetine HCL 40 MG CAPS: 40 | 90 days supply | Qty: 90 | Fill #0

## 2019-04-07 ENCOUNTER — Encounter (HOSPITAL_COMMUNITY): Payer: Self-pay

## 2019-04-07 ENCOUNTER — Other Ambulatory Visit: Payer: Self-pay

## 2019-04-07 ENCOUNTER — Emergency Department (HOSPITAL_COMMUNITY)
Admission: EM | Admit: 2019-04-07 | Discharge: 2019-04-07 | Disposition: A | Discharge status: Home / Self Care | Payer: 59 | Attending: Emergency Medicine | Admitting: Emergency Medicine

## 2019-04-07 DIAGNOSIS — S39012A Strain of muscle, fascia and tendon of lower back, initial encounter: Secondary | ICD-10-CM | POA: Insufficient documentation

## 2019-04-07 DIAGNOSIS — Y9389 Activity, other specified: Secondary | ICD-10-CM | POA: Diagnosis not present

## 2019-04-07 DIAGNOSIS — T148XXA Other injury of unspecified body region, initial encounter: Secondary | ICD-10-CM

## 2019-04-07 DIAGNOSIS — Y9289 Other specified places as the place of occurrence of the external cause: Secondary | ICD-10-CM | POA: Diagnosis not present

## 2019-04-07 DIAGNOSIS — M545 Low back pain: Secondary | ICD-10-CM | POA: Diagnosis present

## 2019-04-07 DIAGNOSIS — X509XXA Other and unspecified overexertion or strenuous movements or postures, initial encounter: Secondary | ICD-10-CM | POA: Diagnosis not present

## 2019-04-07 DIAGNOSIS — Y999 Unspecified external cause status: Secondary | ICD-10-CM | POA: Insufficient documentation

## 2019-04-07 MED ORDER — CYCLOBENZAPRINE HCL 10 MG PO TABS: 10.0000 mg | ORAL_TABLET | Freq: Two times a day (BID) | ORAL | 0 refills | Status: DC | PRN

## 2019-04-07 MED ORDER — METHYLPREDNISOLONE 4 MG PO TBPK: ORAL_TABLET | ORAL | 0 refills | Status: DC

## 2019-04-07 NOTE — ED Triage Notes (Signed)
Pt states he fell off a roof on 03/22/19.Pt has been working out since then. Pt has been having severe lower back pain. Pt ha difficulty getting out of bed. Pt is able to ambulate from wheelchair to the bed. Pt took 4 ibuprofen at 0930.

## 2019-04-07 NOTE — ED Provider Notes (Signed)
[facility]-EMERGENCY DEPT Provider Note   CSN: [id number] Arrival date & time: 04/07/19  1153    History   Chief Complaint Chief Complaint  Patient presents with  . Back Pain    HPI Chase Nunez is a 43 y.o. male.     The history is provided by the patient.  Back Pain  Location:  Lumbar spine Quality:  Aching Radiates to:  Does not radiate Pain severity:  Mild Onset quality:  Gradual Duration:  2 days Timing:  Intermittent Progression:  Waxing and waning Chronicity:  New Context comment:  Injured back doing kettlebells swings Relieved by:  Nothing Worsened by:  Palpation Associated symptoms: no abdominal pain, no abdominal swelling, no bladder incontinence, no bowel incontinence, no chest pain, no dysuria, no fever, no numbness, no pelvic pain, no perianal numbness and no tingling     Past Medical History:  Diagnosis Date  . Alcohol consuption of more than two drinks per day   . Anxiety   . Arthritis   . Depression   . H/O drug abuse (HCC)    none per patient since age 16  . History of chicken pox     Patient Active Problem List   Diagnosis Date Noted  . Alcohol consuption of more than two drinks per day   . Anxiety   . Depression   . Obsessive-compulsive disorder 12/17/2017  . Generalized anxiety disorder 12/17/2017    Past Surgical History:  Procedure Laterality Date  . SHOULDER ARTHROSCOPY          Home Medications    Prior to Admission medications   Medication Sig Start Date End Date Taking? Authorizing Provider  cyclobenzaprine (FLEXERIL) 10 MG tablet Take 1 tablet (10 mg total) by mouth 2 (two) times daily as needed for up to 20 doses for muscle spasms. 04/07/19   Kenric Ginger, DO  FLUoxetine (PROZAC) 40 MG capsule Take 1 capsule (40 mg total) by mouth daily. 03/05/19   Jarold Motto, PA  ibuprofen (ADVIL,MOTRIN) 200 MG tablet Take 600-800 mg by mouth as needed.    [provider]  methylPREDNISolone (MEDROL  DOSEPAK) 4 MG TBPK tablet Follow package insert 04/07/19   Sten Dematteo, DO  Multiple Vitamins-Minerals (MULTIVITAMIN WITH MINERALS) tablet Take 1 tablet by mouth daily.    [provider]    Family History Family History  Problem Relation Age of Onset  . Thyroid disease Mother   . Birth defects Mother   . Miscarriages / [place] Mother   . Heart disease Father   . Hypertension Father   . Alcohol abuse Father   . Arthritis Father   . Drug abuse Father   . High Cholesterol Father   . Heart disease Maternal Grandfather   . Hyperlipidemia Maternal Grandfather   . COPD Maternal Grandfather   . Diabetes Paternal Grandmother   . Heart disease Paternal Grandmother   . Kidney disease Paternal Grandmother   . Heart disease Paternal Grandfather   . Diabetes Paternal Grandfather   . Hyperlipidemia Paternal Grandfather   . COPD Maternal Grandmother   . Colon cancer Neg Hx   . Prostate cancer Neg Hx     Social History Social History   Tobacco Use  . Smoking status: Never Smoker  . Smokeless tobacco: Current User    Types: Snuff  Substance Use Topics  . Alcohol use: Yes    Alcohol/week: 10.0 standard drinks    Types: 10 Shots of liquor per week  . Drug  use: No     Allergies   Patient has no known allergies.   Review of Systems Review of Systems  Constitutional: Negative for chills and fever.  HENT: Negative for ear pain and sore throat.   Eyes: Negative for pain and visual disturbance.  Respiratory: Negative for cough and shortness of breath.   Cardiovascular: Negative for chest pain and palpitations.  Gastrointestinal: Negative for abdominal pain, bowel incontinence and vomiting.  Genitourinary: Negative for bladder incontinence, dysuria, hematuria and pelvic pain.  Musculoskeletal: Positive for back pain. Negative for arthralgias.  Skin: Negative for color change and rash.  Neurological: Negative for tingling, seizures, syncope and numbness.  All other  systems reviewed and are negative.    Physical Exam Updated Vital Signs  ED Triage Vitals  Enc Vitals Group     BP 04/07/19 1158 (!) 137/95     Pulse Rate 04/07/19 1158 71     Resp 04/07/19 1158 15     Temp 04/07/19 1158 97.8 F (36.6 C)     Temp Source 04/07/19 1158 Oral     SpO2 04/07/19 1158 98 %     Weight 04/07/19 1157 190 lb (86.2 kg)     Height 04/07/19 1157  (1.676 m)     Head Circumference --      Peak Flow --      Pain Score 04/07/19 1157 3     Pain Loc --      Pain Edu? --      Excl. in GC? --     Physical Exam Vitals signs and nursing note reviewed.  Constitutional:      Appearance: He is well-developed.  HENT:     Head: Normocephalic and atraumatic.     Nose: Nose normal.     Mouth/Throat:     Mouth: Mucous membranes are moist.  Eyes:     Extraocular Movements: Extraocular movements intact.     Conjunctiva/sclera: Conjunctivae normal.     Pupils: Pupils are equal, round, and reactive to light.  Neck:     Musculoskeletal: Normal range of motion and neck supple.  Cardiovascular:     Rate and Rhythm: Normal rate and regular rhythm.     Pulses: Normal pulses.     Heart sounds: Normal heart sounds. No murmur.  Pulmonary:     Effort: Pulmonary effort is normal. No respiratory distress.     Breath sounds: Normal breath sounds.  Abdominal:     General: There is no distension.     Palpations: Abdomen is soft.     Tenderness: There is no abdominal tenderness.  Musculoskeletal:        General: Tenderness (paraspinal tenderness to b/l lumbar spine) present.     Comments: No midline spinal tenderness  Skin:    General: Skin is warm and dry.     Capillary Refill: Capillary refill takes less than 2 seconds.  Neurological:     General: No focal deficit present.     Mental Status: He is alert and oriented to person, place, and time.     Cranial Nerves: No cranial nerve deficit.     Sensory: No sensory deficit.     Motor: No weakness.     Coordination:  Coordination normal.     Gait: Gait normal.  Psychiatric:        Mood and Affect: Mood normal.      ED Treatments / Results  Labs (all labs ordered are listed, but only abnormal results are displayed)  Labs Reviewed - No data to display  EKG None  Radiology No results found.  Procedures Procedures (including critical care time)  Medications Ordered in ED Medications - No data to display   Initial Impression / Assessment and Plan / ED Course  I have reviewed the triage vital signs and the nursing notes.  Pertinent labs & imaging results that were available during my care of the patient were reviewed by me and considered in my medical decision making (see chart for details).     Kathreen CosierJames Belote is a 43 year old male with no significant medical history who presents to the ED with back pain.  Patient states that he started having low back pain about 2 days ago while doing kettle bell swings.  He has been taking Motrin with some relief.  Patient having intermittent back spasms.  No numbness or tingling or weakness of his legs.  No loss of bowel or bladder.  No saddle anesthesia.  Patient states that he did have a fall off of a roof about 2 weeks ago from a height of about 6 feet.  Did not have any issues from that.  States that he was riding dirt bikes without any pain over that time until he injured his back doing [employer] Companykettle bells 2 days ago.  Patient has some paraspinal tenderness in the lumbar region on exam.  No midline spinal tenderness.  Neurologically he is intact. No concern for compressive cord syndrome. Suspect likely muscle spasm versus mild disc herniation.  Recommend treatment with exercises, continued use of Motrin and Tylenol.  Will prescribe Solu-Medrol Dosepak and Flexeril.  Given return precautions.  Recommend follow-up with primary care doctor as he may need referral to physical therapy.  If he feels that he may need more advanced imaging like an MRI.  However, likely spasm at  this time.  Given return precautions and discharged from ED in good condition.  This chart was dictated using voice recognition software.  Despite best efforts to proofread,  errors can occur which can change the documentation meaning.    Final Clinical Impressions(s) / ED Diagnoses   Final diagnoses:  Muscle strain    ED Discharge Orders         Ordered    methylPREDNISolone (MEDROL DOSEPAK) 4 MG TBPK tablet     04/07/19 1217    cyclobenzaprine (FLEXERIL) 10 MG tablet  2 times daily PRN     04/07/19 1217           Virgina NorfolkCuratolo, Suhey Radford, DO 04/07/19 1218

## 2019-04-07 NOTE — Discharge Instructions (Addendum)
Do not mix medications with alcohol or drugs.

## 2019-04-07 NOTE — ED Notes (Signed)
Bed: JQ73 Expected date:  Expected time:  Means of arrival:  Comments: Hold/back pain

## 2019-04-15 ENCOUNTER — Encounter: Payer: Self-pay | Admitting: Physician Assistant

## 2019-04-15 ENCOUNTER — Ambulatory Visit (INDEPENDENT_AMBULATORY_CARE_PROVIDER_SITE_OTHER): Payer: 59 | Admitting: Physician Assistant

## 2019-04-15 DIAGNOSIS — S39012D Strain of muscle, fascia and tendon of lower back, subsequent encounter: Secondary | ICD-10-CM

## 2019-04-15 MED ORDER — MELOXICAM 15 MG PO TABS: 15.0000 mg | ORAL_TABLET | Freq: Every day | ORAL | 0 refills | Status: DC

## 2019-04-15 NOTE — Progress Notes (Signed)
Virtual Visit via Video   I connected with Chase Nunez on 04/15/19 at  9:40 AM EDT by a video enabled telemedicine application and verified that I am speaking with the correct person using two identifiers. Location patient: Home Location provider: [facility], Office Persons participating in the virtual visit: Chase Nunez, Jarold MottoSamantha Valin Nunez, [place], [name], LPN  I discussed the limitations of evaluation and management by telemedicine and the availability of in person appointments. The patient expressed understanding and agreed to proceed.  I [name], LPN acted as a Neurosurgeonscribe for Energy East CorporationSamantha Izek Corvino, VF CorporationPA-C.  Subjective:   HPI: Muscle strain Pt for follow up today was seen in the ED on 4/29 for muscle strain. He had a muscle strain after doing kettle bell swings, and did have a fall from a roof about 2 weeks prior. He was given medrol dose pack and flexeril. He feels improved overall while taking the prednisone, but feels like his pain is returning this morning. Denies bony pain, fevers, urinary issues, changes in bowels. He also states that when he was a teenager he "broke his back." He has not had any imaging of his back with this new onset of pain.   ROS: See pertinent positives and negatives per HPI.  Patient Active Problem List   Diagnosis Date Noted  . Alcohol consuption of more than two drinks per day   . Anxiety   . Depression   . Obsessive-compulsive disorder 12/17/2017  . Generalized anxiety disorder 12/17/2017    Social History   Tobacco Use  . Smoking status: Never Smoker  . Smokeless tobacco: Current User    Types: Snuff  Substance Use Topics  . Alcohol use: Yes    Alcohol/week: 10.0 standard drinks    Types: 10 Shots of liquor per week    Current Outpatient Medications:  .  cyclobenzaprine (FLEXERIL) 10 MG tablet, Take 1 tablet (10 mg total) by mouth 2 (two) times daily as needed for up to 20 doses for muscle spasms., Disp: 20 tablet, Rfl: 0 .  FLUoxetine  (PROZAC) 40 MG capsule, Take 1 capsule (40 mg total) by mouth daily., Disp: 90 capsule, Rfl: 3 .  ibuprofen (ADVIL,MOTRIN) 200 MG tablet, Take 600-800 mg by mouth as needed., Disp: , Rfl:  .  meloxicam (MOBIC) 15 MG tablet, Take 1 tablet (15 mg total) by mouth daily., Disp: 30 tablet, Rfl: 0 .  methylPREDNISolone (MEDROL DOSEPAK) 4 MG TBPK tablet, Follow package insert, Disp: 21 tablet, Rfl: 0 .  Multiple Vitamins-Minerals (MULTIVITAMIN WITH MINERALS) tablet, Take 1 tablet by mouth daily., Disp: , Rfl:   No Known Allergies  Objective:   VITALS: Per patient if applicable, see vitals. GENERAL: Alert, appears well and in no acute distress. HEENT: Atraumatic, conjunctiva clear, no obvious abnormalities on inspection of external nose and ears. NECK: Normal movements of the head and neck. CARDIOPULMONARY: No increased WOB. Speaking in clear sentences. I:E ratio WNL.  MS: Moves all visible extremities without noticeable abnormality. PSYCH: Pleasant and cooperative, well-groomed. Speech normal rate and rhythm. Affect is appropriate. Insight and judgement are appropriate. Attention is focused, linear, and appropriate.  NEURO: CN grossly intact. Oriented as arrived to appointment on time with no prompting. Moves both UE equally.  SKIN: No obvious lesions, wounds, erythema, or cyanosis noted on face or hands.  Assessment and Plan:   Chase Nunez was seen today for follow-up.  Diagnoses and all orders for this visit:  Strain of lumbar region, subsequent encounter Uncontrolled. Will start daily meloxicam.  Continue flexeril. He is going to come in for xray tomorrow. May need to see sports medicine if persists. -     DG Lumbar Spine Complete; Future  Other orders -     meloxicam (MOBIC) 15 MG tablet; Take 1 tablet (15 mg total) by mouth daily.   . Reviewed expectations re: course of current medical issues. . Discussed self-management of symptoms. . Outlined signs and symptoms indicating need for more  acute intervention. . Patient verbalized understanding and all questions were answered. Chase Nunez Health Maintenance issues including appropriate healthy diet, exercise, and smoking avoidance were discussed with patient. . See orders for this visit as documented in the electronic medical record.  I discussed the assessment and treatment plan with the patient. The patient was provided an opportunity to ask questions and all were answered. The patient agreed with the plan and demonstrated an understanding of the instructions.   The patient was advised to call back or seek an in-person evaluation if the symptoms worsen or if the condition fails to improve as anticipated.   CMA or LPN served as scribe during this visit. History, Physical, and Plan performed by medical provider. The above documentation has been reviewed and is accurate and complete.  [place], [place] 04/15/2019

## 2019-04-16 ENCOUNTER — Other Ambulatory Visit: Payer: Self-pay

## 2019-04-16 ENCOUNTER — Ambulatory Visit (INDEPENDENT_AMBULATORY_CARE_PROVIDER_SITE_OTHER): Payer: 59

## 2019-04-16 ENCOUNTER — Other Ambulatory Visit: Payer: 59

## 2019-04-16 DIAGNOSIS — M545 Low back pain: Secondary | ICD-10-CM | POA: Diagnosis not present

## 2019-04-16 DIAGNOSIS — S39012D Strain of muscle, fascia and tendon of lower back, subsequent encounter: Secondary | ICD-10-CM

## 2019-05-18 IMAGING — DX DG WRIST COMPLETE 3+V*L*
4 series · 4 of 4 positions shown · non-contrast
Comparison: None.

CLINICAL DATA: Worsening left wrist pain for 6 months. Fall on
outstretched hand.

EXAM:
LEFT WRIST - COMPLETE 3+ VIEW

[wrist pa]
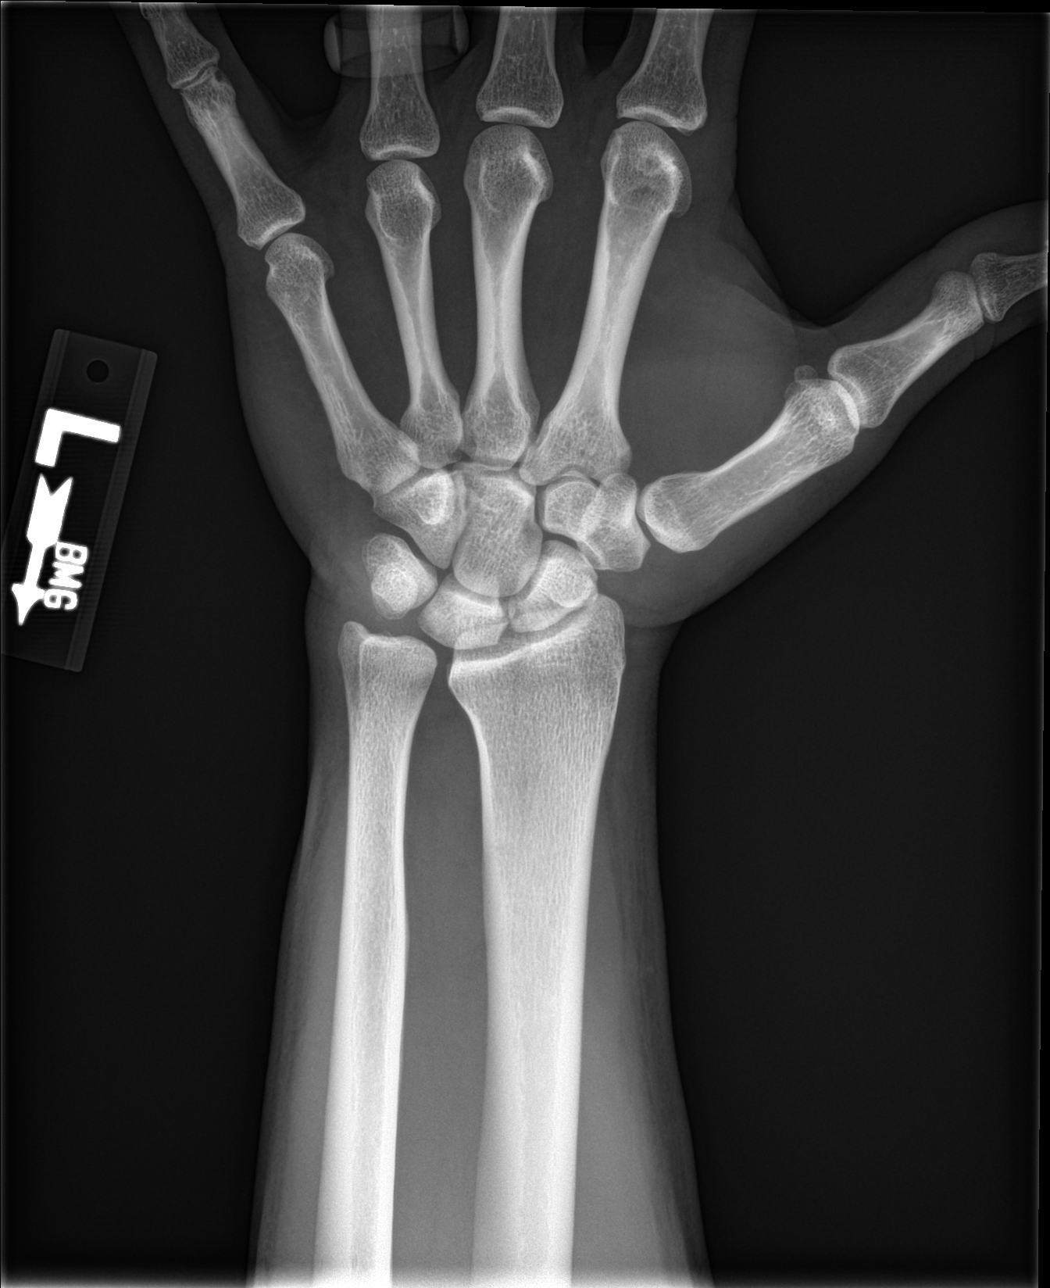

[wrist obl]
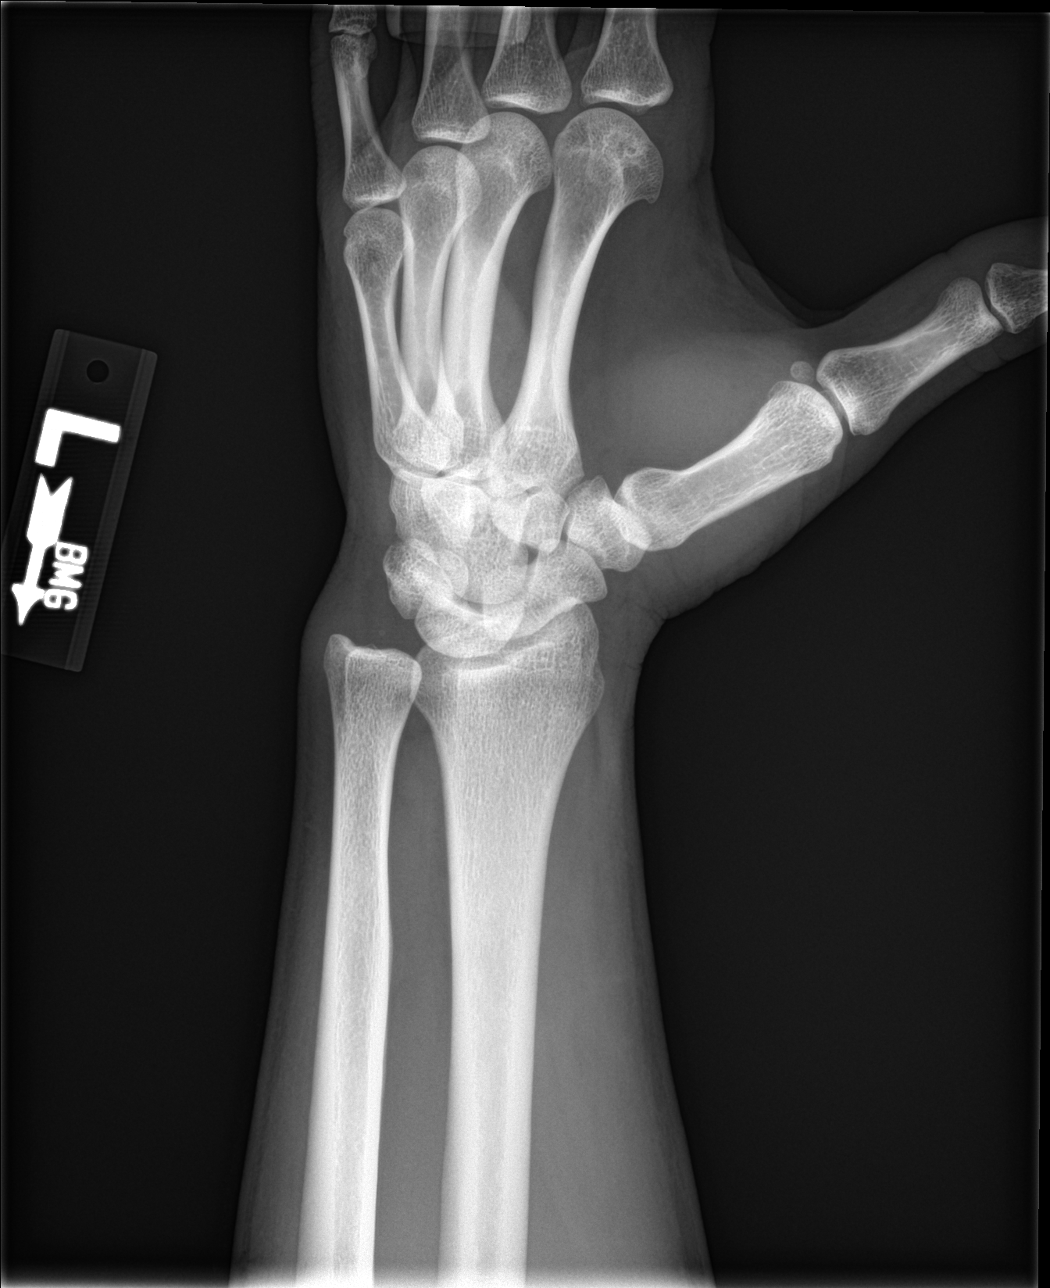

[wrist lat]
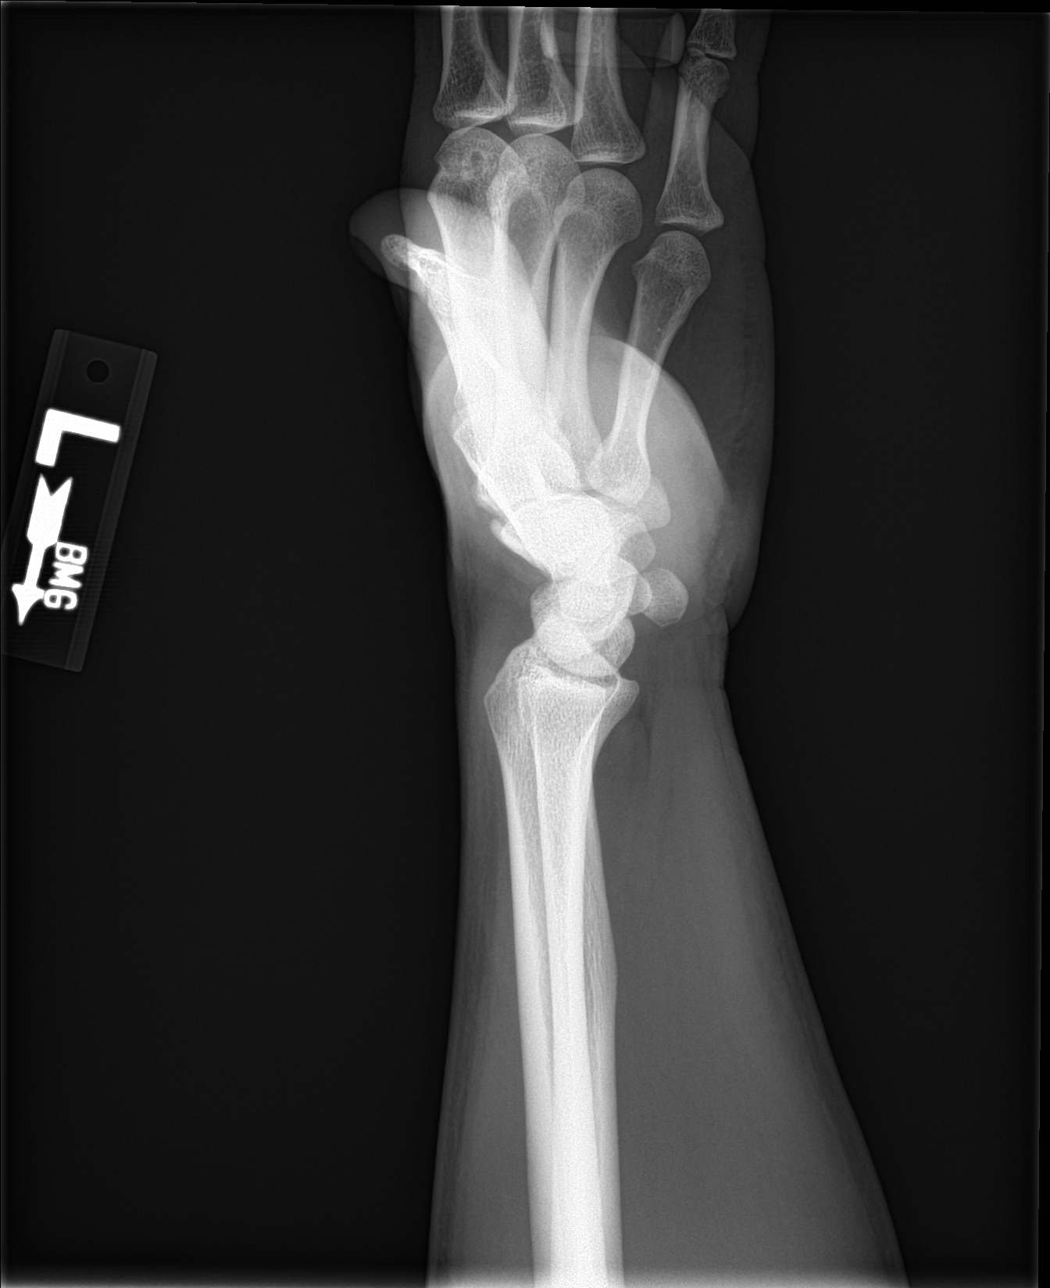

[wrist navicular]
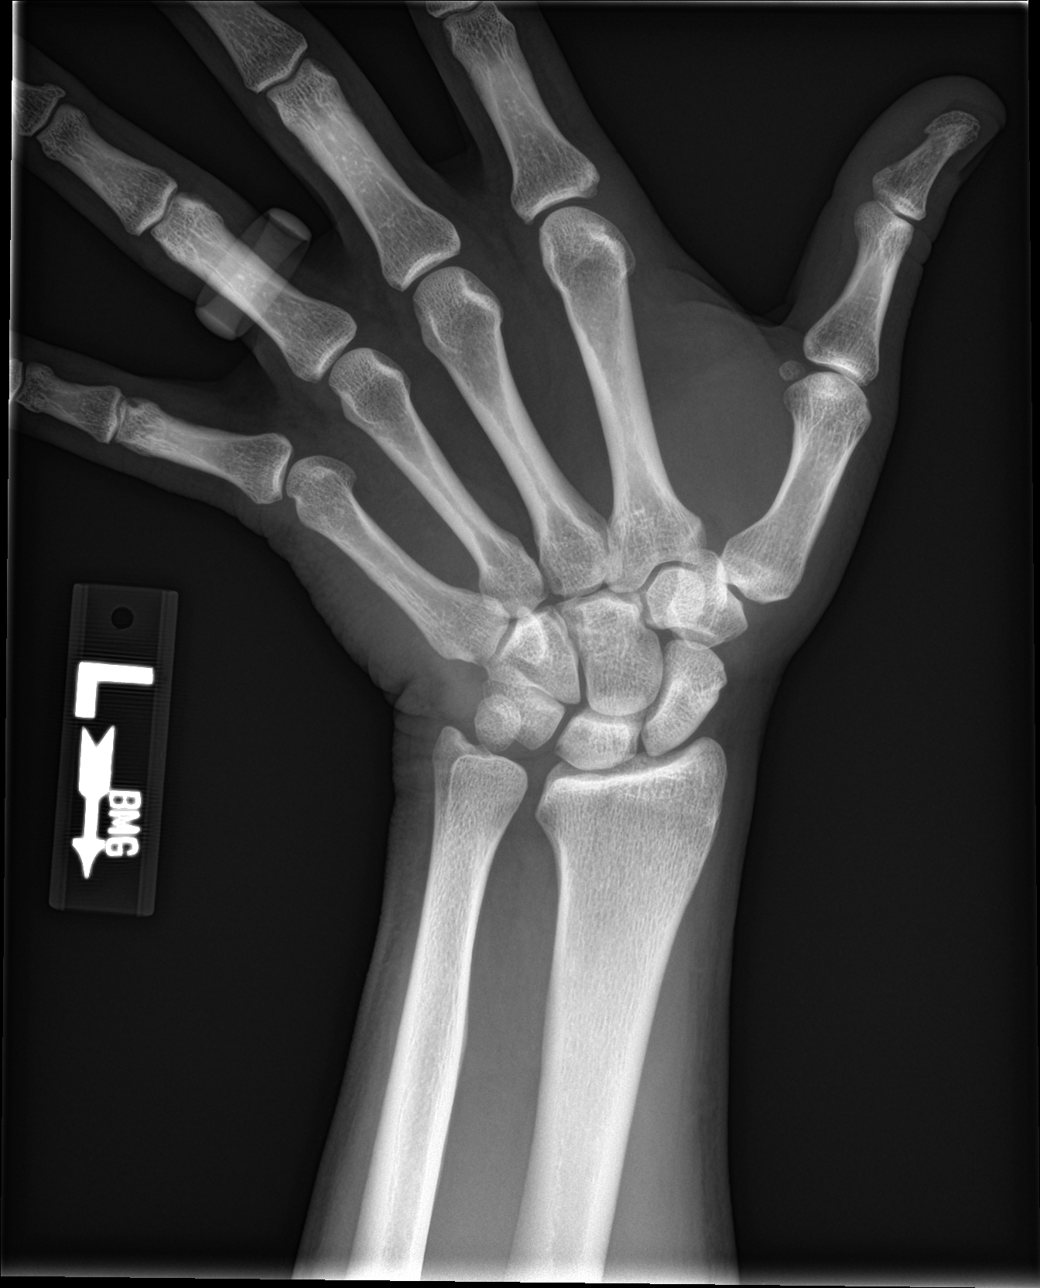

[4 of 4 positions shown; findings below may reference images not displayed]

FINDINGS: There is no evidence of fracture or dislocation. There is no
evidence of arthropathy or other focal bone abnormality. Soft
tissues are unremarkable.
IMPRESSION: Negative.

## 2019-06-22 MED FILL — FLUoxetine HCL 40 MG CAPS: 40 | 90 days supply | Qty: 90 | Fill #1

## 2019-09-08 ENCOUNTER — Encounter: Payer: Self-pay | Admitting: Physician Assistant

## 2019-09-08 ENCOUNTER — Other Ambulatory Visit: Payer: Self-pay

## 2019-09-08 ENCOUNTER — Ambulatory Visit: Payer: 59 | Admitting: Physician Assistant

## 2019-09-08 VITALS — BP 124/90 | HR 69 | Temp 98.4°F | Ht 66.0 in | Wt 185.0 lb

## 2019-09-08 DIAGNOSIS — F411 Generalized anxiety disorder: Secondary | ICD-10-CM | POA: Diagnosis not present

## 2019-09-08 DIAGNOSIS — F321 Major depressive disorder, single episode, moderate: Secondary | ICD-10-CM

## 2019-09-08 MED ORDER — FLUOXETINE HCL 40 MG PO CAPS: 40.0000 mg | ORAL_CAPSULE | Freq: Every day | ORAL | 0 refills | Status: DC

## 2019-09-08 MED ORDER — FLUOXETINE HCL 20 MG PO CAPS: 20.0000 mg | ORAL_CAPSULE | Freq: Every morning | ORAL | 0 refills | Status: DC

## 2019-09-08 MED FILL — FLUoxetine HCL 20 MG CAPS: 20 | 90 days supply | Qty: 90 | Fill #0

## 2019-09-08 NOTE — Patient Instructions (Addendum)
It was great to see you!  Increase Prozac to 60 mg. Unfortunately there is not a 60 mg tablet or capsule, so I had to send in two scripts -- one for 20 mg and one for 40 mg. You can take both together for a total of 60 mg daily.  Let's follow-up with a VIRTUAL VISIT in 1 month to see how you are doing, sooner if concerns.  I will have our office therapist, Lattie Haw, give you a call to try to set up a counseling appointment.  Please work on controlling your drinking.  Take care,  Inda Coke PA-C

## 2019-09-08 NOTE — Progress Notes (Signed)
Chase Nunez is a 43 y.o. male is here to discuss: Medication   I acted as a Education administrator for [employer], PA-C Anselmo Pickler, LPN  History of Present Illness:   Chief Complaint  Patient presents with  . Anxiety  . Depression    HPI   Anxiety & Depression Pt c/o increase in depression the past 6 months due to COVID and life in general. Currently taking Prozac 40 mg daily. Pt says he has been on this a long time and does not feel like it is working and would like to discuss other medication if possible. Denies SI/HI at present.  He was taking Zoloft at one point prior to Prozac but eventually came off of it because it seemed to have been working well.  He has been on higher doses of Prozac in the past, but he thinks that he may have had some ED with this. He is unsure.  Has had some increased binge drinking, uses this as a coping mechanism.  Interested in seeing a counselor.   There are no preventive care reminders to display for this patient.  Past Medical History:  Diagnosis Date  . Alcohol consuption of more than two drinks per day   . Anxiety   . Arthritis   . Depression   . H/O drug abuse ([place])    none per patient since age 3  . History of chicken pox      Social History   Socioeconomic History  . Marital status: Married    Spouse name: Kenney Houseman  . Number of children: 1  . Years of education: Not on file  . Highest education level: Not on file  Occupational History  . Not on file  Social Needs  . Financial resource strain: Not on file  . Food insecurity    Worry: Not on file    Inability: Not on file  . Transportation needs    Medical: Not on file    Non-medical: Not on file  Tobacco Use  . Smoking status: Never Smoker  . Smokeless tobacco: Current User    Types: Snuff  Substance and Sexual Activity  . Alcohol use: Yes    Alcohol/week: 10.0 standard drinks    Types: 10 Shots of liquor per week  . Drug use: No  . Sexual activity: Yes  Lifestyle   . Physical activity    Days per week: Not on file    Minutes per session: Not on file  . Stress: Not on file  Relationships  . Social Herbalist on phone: Not on file    Gets together: Not on file    Attends religious service: Not on file    Active member of club or organization: Not on file    Attends meetings of clubs or organizations: Not on file    Relationship status: Not on file  . Intimate partner violence    Fear of current or ex partner: Not on file    Emotionally abused: Not on file    Physically abused: Not on file    Forced sexual activity: Not on file  Other Topics Concern  . 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 SHOULDER ARTHROSCOPY      Family History  Problem Relation Age of Onset  . Thyroid  disease Mother   . Birth defects Mother   . Miscarriages / [place] Mother   . Heart disease Father   . Hypertension Father   . Alcohol abuse Father   . Arthritis Father   . Drug abuse Father   . High Cholesterol Father   . Heart disease Maternal Grandfather   . Hyperlipidemia Maternal Grandfather   . COPD Maternal Grandfather   . Diabetes Paternal Grandmother   . Heart disease Paternal Grandmother   . Kidney disease Paternal Grandmother   . Heart disease Paternal Grandfather   . Diabetes Paternal Grandfather   . Hyperlipidemia Paternal Grandfather   . COPD Maternal Grandmother   . Colon cancer Neg Hx   . Prostate cancer Neg Hx     PMHx, SurgHx, SocialHx, FamHx, Medications, and Allergies were reviewed in the Visit Navigator and updated as appropriate.   Patient Active Problem List   Diagnosis Date Noted  . Alcohol consuption of more than two drinks per day   . Anxiety   . Depression   . Obsessive-compulsive disorder 12/17/2017  . Generalized anxiety disorder 12/17/2017    Social History    Tobacco Use  . Smoking status: Never Smoker  . Smokeless tobacco: Current User    Types: Snuff  Substance Use Topics  . Alcohol use: Yes    Alcohol/week: 10.0 standard drinks    Types: 10 Shots of liquor per week  . Drug use: No    Current Medications and Allergies:    Current Outpatient Medications:  .  cyclobenzaprine (FLEXERIL) 10 MG tablet, Take 1 tablet (10 mg total) by mouth 2 (two) times daily as needed for up to 20 doses for muscle spasms., Disp: 20 tablet, Rfl: 0 .  ibuprofen (ADVIL,MOTRIN) 200 MG tablet, Take 600-800 mg by mouth as needed., Disp: , Rfl:  .  Magnesium 500 MG CAPS, Take 1 capsule by mouth daily., Disp: , Rfl:  .  Multiple Vitamins-Minerals (MULTIVITAMIN WITH MINERALS) tablet, Take 1 tablet by mouth daily., Disp: , Rfl:  .  FLUoxetine (PROZAC) 20 MG capsule, Take 1 capsule (20 mg total) by mouth every morning., Disp: 90 capsule, Rfl: 0 .  FLUoxetine (PROZAC) 40 MG capsule, Take 1 capsule (40 mg total) by mouth daily., Disp: 90 capsule, Rfl: 0  No Known Allergies  Review of Systems   ROS Negative unless otherwise specified per HPI.  Vitals:   Vitals:   09/08/19 1045  BP: 124/90  Pulse: 69  Temp: 98.4 F (36.9 C)  TempSrc: Temporal  SpO2: 96%  Weight: 185 lb (83.9 kg)  Height: 5\' 6"  (1.676 m)     Body mass index is 29.86 kg/m.   Physical Exam:    Physical Exam Vitals signs and nursing note reviewed.  Constitutional:      General: He is not in acute distress.    Appearance: He is well-developed. He is not ill-appearing or toxic-appearing.  Cardiovascular:     Rate and Rhythm: Normal rate and regular rhythm.     Pulses: Normal pulses.     Heart sounds: Normal heart sounds, S1 normal and S2 normal.     Comments: No LE edema Pulmonary:     Effort: Pulmonary effort is normal.     Breath sounds: Normal breath sounds.  Skin:    General: Skin is warm and dry.  Neurological:     Mental Status: He is alert.     GCS: GCS eye subscore is 4.  GCS verbal subscore is 5. GCS  motor subscore is 6.  Psychiatric:        Speech: Speech normal.        Behavior: Behavior normal. Behavior is cooperative.      Assessment and Plan:    Samari was seen today for anxiety and depression.  Diagnoses and all orders for this visit:  Generalized anxiety disorder; Depression, major, single episode, moderate (HCC) Uncontrolled. We are going to trial Prozac 60 mg. I discussed with patient that if they develop any SI, to tell someone immediately and seek medical attention. Follow-up in 1 month virtually. May switch to Trintellix if ineffective. I have also asked Colen Darling (office therapist) to reach out to him for an appointment.  Other orders -     FLUoxetine (PROZAC) 40 MG capsule; Take 1 capsule (40 mg total) by mouth daily. -     FLUoxetine (PROZAC) 20 MG capsule; Take 1 capsule (20 mg total) by mouth every morning.    . Reviewed expectations re: course of current medical issues. . Discussed self-management of symptoms. . Outlined signs and symptoms indicating need for more acute intervention. . Patient verbalized understanding and all questions were answered. . See orders for this visit as documented in the electronic medical record. . Patient received an After Visit Summary.  CMA or LPN served as scribe during this visit. History, Physical, and Plan performed by medical provider. The above documentation has been reviewed and is accurate and complete.   Jarold Motto, PA-C [place] [place] 09/08/2019  Follow-up: Return in about 1 month (around 10/08/2019) for VIRTUAL Medication F/U.

## 2019-09-27 ENCOUNTER — Ambulatory Visit: Payer: 59 | Admitting: Psychology

## 2019-10-08 MED FILL — FLUoxetine HCL 40 MG CAPS: 40 | 90 days supply | Qty: 90 | Fill #2

## 2019-10-11 ENCOUNTER — Ambulatory Visit (INDEPENDENT_AMBULATORY_CARE_PROVIDER_SITE_OTHER): Payer: 59 | Admitting: Physician Assistant

## 2019-10-11 ENCOUNTER — Encounter: Payer: Self-pay | Admitting: Physician Assistant

## 2019-10-11 ENCOUNTER — Ambulatory Visit (INDEPENDENT_AMBULATORY_CARE_PROVIDER_SITE_OTHER): Payer: 59 | Admitting: Psychology

## 2019-10-11 DIAGNOSIS — F4321 Adjustment disorder with depressed mood: Secondary | ICD-10-CM

## 2019-10-11 DIAGNOSIS — M25519 Pain in unspecified shoulder: Secondary | ICD-10-CM | POA: Diagnosis not present

## 2019-10-11 DIAGNOSIS — Z789 Other specified health status: Secondary | ICD-10-CM | POA: Diagnosis not present

## 2019-10-11 DIAGNOSIS — F321 Major depressive disorder, single episode, moderate: Secondary | ICD-10-CM

## 2019-10-11 DIAGNOSIS — F411 Generalized anxiety disorder: Secondary | ICD-10-CM | POA: Diagnosis not present

## 2019-10-11 DIAGNOSIS — G8929 Other chronic pain: Secondary | ICD-10-CM

## 2019-10-11 NOTE — Progress Notes (Signed)
Virtual Visit via Video   I connected with Chase Nunez on 10/11/19 at 11:20 AM EST by a video enabled telemedicine application and verified that I am speaking with the correct person using two identifiers. Location patient: Home Location provider: [facility], Office Persons participating in the virtual visit: Chase Nunez, Chase Coke PA-C, Chase Pickler, LPN   I discussed the limitations of evaluation and management by telemedicine and the availability of in person appointments. The patient expressed understanding and agreed to proceed.  I acted as a Education administrator for [employer], PA-C [employer], LPN  Subjective:   HPI:   Anxiety & Depression Pt following up today, last visit 9/30 Prozac was increased to 60 mg daily.  He also saw Chase Nunez after I saw him.  He states that she was concerned about his drinking.  He reports that he is drinking a significant amount of Chase Nunez, and drink a lot this weekend due to holiday.  Reports that he is currently drinking about 4 standard liquor drinks a day. He does feel like things are slightly improved around the house with his mood but he is worried about his overall mood not significantly improved with the medication increase.  He denies any suicidal or homicidal thoughts.  Shoulder pain He is very active with weightlifting and other activity.  He has had some ongoing shoulder pain and is wondering if he needs to take daily medication for this.  He also thinks that he could use a shoulder injection.  Denies: Numbness, tingling, weakness.  ROS: See pertinent positives and negatives per HPI.  Patient Active Problem List   Diagnosis Date Noted  . Alcohol consuption of more than two drinks per day   . Anxiety   . Depression   . Obsessive-compulsive disorder 12/17/2017  . Generalized anxiety disorder 12/17/2017    Social History   Tobacco Use  . Smoking status: Never Smoker  . Smokeless tobacco: Current User    Types: Snuff   Substance Use Topics  . Alcohol use: Yes    Alcohol/week: 10.0 standard drinks    Types: 10 Shots of liquor per week    Current Outpatient Medications:  .  cyclobenzaprine (FLEXERIL) 10 MG tablet, Take 1 tablet (10 mg total) by mouth 2 (two) times daily as needed for up to 20 doses for muscle spasms., Disp: 20 tablet, Rfl: 0 .  FLUoxetine (PROZAC) 20 MG capsule, Take 1 capsule (20 mg total) by mouth every morning., Disp: 90 capsule, Rfl: 0 .  FLUoxetine (PROZAC) 40 MG capsule, Take 1 capsule (40 mg total) by mouth daily., Disp: 90 capsule, Rfl: 0 .  ibuprofen (ADVIL,MOTRIN) 200 MG tablet, Take 600-800 mg by mouth as needed., Disp: , Rfl:  .  Magnesium 500 MG CAPS, Take 1 capsule by mouth daily., Disp: , Rfl:  .  Multiple Vitamins-Minerals (MULTIVITAMIN WITH MINERALS) tablet, Take 1 tablet by mouth daily., Disp: , Rfl:  .  Omega-3 Fatty Acids (FISH OIL) 1000 MG CAPS, Take by mouth., Disp: , Rfl:   No Known Allergies  Objective:   VITALS: Per patient if applicable, see vitals. GENERAL: Alert, appears well and in no acute distress. HEENT: Atraumatic, conjunctiva clear, no obvious abnormalities on inspection of external nose and ears. NECK: Normal movements of the head and neck. CARDIOPULMONARY: No increased WOB. Speaking in clear sentences. I:E ratio WNL.  MS: Moves all visible extremities without noticeable abnormality. PSYCH: Pleasant and cooperative, well-groomed. Speech normal rate and rhythm. Affect is appropriate. Insight  and judgement are appropriate. Attention is focused, linear, and appropriate.  NEURO: CN grossly intact. Oriented as arrived to appointment on time with no prompting. Moves both UE equally.  SKIN: No obvious lesions, wounds, erythema, or cyanosis noted on face or hands.  Assessment and Plan:   Chase Nunez was seen today for anxiety and depression.  Diagnoses and all orders for this visit:  Chronic shoulder pain, unspecified laterality Will refer to Dr. Denyse Nunez for  further evaluation and treatment. -     Ambulatory referral to Sports Medicine  Generalized anxiety disorder; Depression, major, single episode, moderate (HCC); Alcohol consuption of more than two drinks per day Made a goal today that he will have no more than 2 standard drinks of liquor per day.  We discussed options of decreasing liquor and seeing if this helps his mood versus increasing medication.  He is going to try to increase his Prozac to 80 mg/day and follow-up with me in 1 to 2 months, sooner if needed.  I discussed with patient that if they develop any SI, to tell someone immediately and seek medical attention.  . Reviewed expectations re: course of current medical issues. . Discussed self-management of symptoms. . Outlined signs and symptoms indicating need for more acute intervention. . Patient verbalized understanding and all questions were answered. Marland Kitchen Health Maintenance issues including appropriate healthy diet, exercise, and smoking avoidance were discussed with patient. . See orders for this visit as documented in the electronic medical record.  I discussed the assessment and treatment plan with the patient. The patient was provided an opportunity to ask questions and all were answered. The patient agreed with the plan and demonstrated an understanding of the instructions.   The patient was advised to call back or seek an in-person evaluation if the symptoms worsen or if the condition fails to improve as anticipated.   CMA or LPN served as scribe during this visit. History, Physical, and Plan performed by medical provider. The above documentation has been reviewed and is accurate and complete.   [place], [place] 10/11/2019

## 2019-10-13 ENCOUNTER — Ambulatory Visit: Payer: 59 | Admitting: Family Medicine

## 2019-10-13 ENCOUNTER — Encounter: Payer: Self-pay | Admitting: Family Medicine

## 2019-10-13 ENCOUNTER — Other Ambulatory Visit: Payer: Self-pay

## 2019-10-13 VITALS — BP 148/96 | HR 73 | Ht 66.0 in | Wt 186.0 lb

## 2019-10-13 DIAGNOSIS — M25512 Pain in left shoulder: Secondary | ICD-10-CM | POA: Diagnosis not present

## 2019-10-13 MED ORDER — DICLOFENAC SODIUM 1 % TD GEL: 4.0000 g | Freq: Four times a day (QID) | TRANSDERMAL | 11 refills | Status: AC

## 2019-10-13 MED ORDER — PREDNISONE 50 MG PO TABS: 50.0000 mg | ORAL_TABLET | Freq: Every day | ORAL | 0 refills | Status: DC

## 2019-10-13 MED FILL — DICLOFENAC SODIUM 1 % GEL: 1 | 6 days supply | Qty: 100 | Fill #0

## 2019-10-13 MED FILL — predniSONE 50 MG TABS: 50 | 5 days supply | Qty: 5 | Fill #0

## 2019-10-13 NOTE — Patient Instructions (Addendum)
Thank you for coming in today. Attend PT.  Recheck in 4 weeks.  Use the diclofenac gel.   Please perform the exercise program that we have prepared for you and gone over in detail on a daily basis.  In addition to the handout you were provided you can access your program through: [email or web address]   Your unique program code is:  [email or web address]

## 2019-10-13 NOTE — Progress Notes (Addendum)
p  Subjective:    I'm seeing this patient as a consultation for:  Chase Motto, PA   CC: [name] pain.  I, Chase Nunez, LAT, ATC, am serving as scribe for Dr. Clementeen Graham.  HPI: Chase Nunez was seen by his PCP on November 2 for chronic shoulder pain.  He was referred to me for further evaluation and management. Pt reports [name] pain for a while but reports increased pain over the past few weeks after sleeping on his L side on a new mattress.  He reports a burning pain in the [name] pain w/ weakness in the [name].  He reports prior [name] surgery in 2012 when he had a R RC and labral repair.  He feels like his [name] symptoms are similar to what he experienced on the L.  Aggravating factors include active [name] aBd AROm and if his L arm hangs by his side.  He notes that he feels his arm to catch her symptoms get stuck with abduction.  Pt has been taking IBU which is helped only a little.  Past medical history, Surgical history, Family history not pertinant except as noted below, Social history, Allergies, and medications have been entered into the medical record, reviewed, and no changes needed.   Review of Systems: No headache, visual changes, nausea, vomiting, diarrhea, constipation, dizziness, abdominal pain, skin rash, fevers, chills, night sweats, weight loss, swollen lymph nodes, body aches, joint swelling, muscle aches, chest pain, shortness of breath, mood changes, visual or auditory hallucinations.   Objective:    Vitals:   10/13/19 0913  BP: (!) 148/96  Pulse: 73  SpO2: 96%   General: Well Developed, well nourished, and in no acute distress.  Neuro/Psych: Alert and oriented x3, extra-ocular muscles intact, able to move all 4 extremities, sensation grossly intact. Skin: Warm and dry, no rashes noted.  Respiratory: Not using accessory muscles, speaking in full sentences, trachea midline.  Cardiovascular: Pulses palpable, no extremity edema. Abdomen: Does  not appear distended. MSK:  C-spine: Nontender to spinal midline.  Normal cervical motion. Reflexes cap refill and sensation are equal and normal throughout.  Left shoulder: Normal-appearing nontender. Full abduction external and internal rotation range of motion pain with abduction beyond 100 degrees. Strength: Diminished 4/5 to abduction, and 4/5 to external rotation. 5/5 internal rotation. Positive Hawkins and Neer's test. Positive empty can test. Positive O'Brien's test. Positive anterior apprehension test and normal relocation test. Positive posterior apprehension test and normal relocation test. Negative clunk or grind test. Mildly positive Yergason's test negative speeds test.  Right shoulder: Normal-appearing Nontender Normal range of motion. Normal strength. Negative impingement and labral testing.  Pulses intact bilateral upper extremities.  Impression and Recommendations:    Assessment and Plan: 43 y.o. male with left shoulder pain ongoing for about 2 weeks.  No acute injury.  Patient does have weakness and physical exam signs and symptoms significant for rotator cuff dysfunction as well as possible labral injury.  Given his pain is low but ongoing for about 2 weeks and he does not have a significant injury history reasonable to try conservative management.  Will do short course of oral prednisone (as patient would like to avoid injection today), diclofenac gel, and referred to physical therapy.  Additionally performed home exercise teaching.  Recheck back in 4 weeks.  If not better at that point neck step would likely be x-ray and MRI.  PDMP not reviewed this encounter. Orders Placed This Encounter  Procedures  .  Ambulatory referral to Physical Therapy    Referral Priority:   Routine    Referral Type:   Physical Medicine    Referral Reason:   Specialty Services Required    Requested Specialty:   Physical Therapy   Meds ordered this encounter  Medications  .  diclofenac sodium (VOLTAREN) 1 % GEL    Sig: Apply 4 g topically 4 (four) times daily. To affected joint.    Dispense:  100 g    Refill:  11  . predniSONE (DELTASONE) 50 MG tablet    Sig: Take 1 tablet (50 mg total) by mouth daily.    Dispense:  5 tablet    Refill:  0    Discussed warning signs or symptoms. Please see discharge instructions. Patient expresses understanding. The above documentation has been reviewed and is accurate and complete Chase Nunez

## 2019-10-20 ENCOUNTER — Encounter: Payer: Self-pay | Admitting: Physical Therapy

## 2019-10-20 ENCOUNTER — Ambulatory Visit (INDEPENDENT_AMBULATORY_CARE_PROVIDER_SITE_OTHER): Payer: 59 | Admitting: Physical Therapy

## 2019-10-20 ENCOUNTER — Other Ambulatory Visit: Payer: Self-pay

## 2019-10-20 DIAGNOSIS — M25512 Pain in left shoulder: Secondary | ICD-10-CM

## 2019-10-21 ENCOUNTER — Encounter: Payer: Self-pay | Admitting: Physical Therapy

## 2019-10-21 NOTE — Therapy (Signed)
[facility] [address] [place], [place], [address] Phone: [phone]   Fax:  [phone]  Physical Therapy Evaluation  Patient Details  Name: Chase Nunez MRN: [id number] Date of Birth: [birth year] Referring Provider (PT): Clementeen Graham   Encounter Date: 10/20/2019  PT End of Session - 10/21/19 1039    Visit Number  1    Number of Visits  12    Date for PT Re-Evaluation  12/01/19    Authorization Type  [facility]    PT Start Time  1302    PT Stop Time  1342    PT Time Calculation (min)  40 min    Activity Tolerance  Patient tolerated treatment well    Behavior During Therapy  [facility] for tasks assessed/performed       Past Medical History:  Diagnosis Date  . Alcohol consuption of more than two drinks per day   . Anxiety   . Arthritis   . Depression   . H/O drug abuse (HCC)    none per patient since age 34  . History of chicken pox     Past Surgical History:  Procedure Laterality Date  . SHOULDER ARTHROSCOPY      There were no vitals filed for this visit.   Subjective Assessment - 10/21/19 1041    Subjective  Pt coaches wrestling. He states increased pain in [name] for a few weeks, no injury to report. He has had previous [name] surgery for RTC and labrum. States pain in L posterior shoulder,and some pain burning into lateral arm. Most pain with reaching out to side and holding arm out to side, feels weak.    Limitations  Lifting;House hold activities    Patient Stated Goals  Decreased pain in [name]    Currently in Pain?  Yes    Pain Score  7     Pain Location  Shoulder    Pain Orientation  Left    Pain Descriptors / Indicators  Aching    Pain Type  Acute pain    Pain Onset  1 to 4 weeks ago    Pain Frequency  Intermittent    Aggravating Factors   reach, reach out to side    Pain Relieving Factors  rest, keep arm at side         [facility] PT Assessment - 10/21/19 0001      Assessment   Medical Diagnosis  L  shoulder     Referring Provider (PT)  Clementeen Graham    Hand Dominance  Right    Prior Therapy  no      Balance Screen   Has the patient fallen in the past 6 months  No      Prior Function   Level of Independence  Independent      Cognition   Overall Cognitive Status  Within Functional Limits for tasks assessed      Posture/Postural Control   Posture Comments  Rounded shoulders bil;       ROM / Strength   AROM / PROM / Strength  AROM;Strength;PROM      AROM   Overall AROM Comments  AROM: flex, IR/ER:  WFL,. Behind back IR mod limitation    AROM Assessment Site  Shoulder    Right/Left Shoulder  Left    Left Shoulder ABduction  110 Degrees      PROM   Overall PROM Comments  PROM: WNL    PROM Assessment Site  Shoulder    Right/Left Shoulder  Left      Strength   Strength Assessment Site  Shoulder    Right/Left Shoulder  Left    Left Shoulder Flexion  4/5    Left Shoulder ABduction  3-/5    Left Shoulder Internal Rotation  4-/5    Left Shoulder External Rotation  4/5      Palpation   Palpation comment  Pain in posterior shoulder,  Most pain deep in joint, non palpable, does have significant tightness in  L UT.      Special Tests   Other special tests  Pain and weakness wtih resisted IR, and with ABD;  Painful empty can,                 Objective measurements completed on examination: See above findings.      [facility] Adult PT Treatment/Exercise - 10/21/19 0001      Exercises   Exercises  Shoulder      Shoulder Exercises: Standing   External Rotation  10 reps    Theraband Level (Shoulder External Rotation)  Level 3 (Green)    Internal Rotation  10 reps    Theraband Level (Shoulder Internal Rotation)  Level 3 (Green)    Extension  10 reps    Theraband Level (Shoulder Extension)  Level 2 (Red)    Extension Limitations  Low Row    Row  20 reps    Theraband Level (Shoulder Row)  Level 3 (Green)    Retraction  10 reps    Theraband Level (Shoulder Retraction)   Level 2 (Red)    Retraction Limitations  Bil Horiz Abd/ Scap pull outs       Shoulder Exercises: Pulleys   ABduction  1 minute      Shoulder Exercises: Stretch   Other Shoulder Stretches  Supine, cane/ AAROM x10              PT Education - 10/21/19 1038    Education Details  PT POC, Exam findings, HEP(reviewed for correct mechanics)    Person(s) Educated  Patient    Methods  Explanation;Demonstration;Verbal cues;Tactile cues    Comprehension  Verbalized understanding;Returned demonstration;Verbal cues required;Tactile cues required;Need further instruction       PT Short Term Goals - 10/21/19 1053      PT SHORT TERM GOAL #1   Title  Pt to be independent with initial HEP    Time  2    Period  Weeks    Status  New    Target Date  11/03/19      PT SHORT TERM GOAL #2   Title  Pt to demo improved abd AROM by at least 10 deg.    Time  2    Period  Weeks    Status  New    Target Date  11/03/19        PT Long Term Goals - 10/21/19 1055      PT LONG TERM GOAL #1   Title  Pt to be independent with final HEP    Time  6    Period  Weeks    Status  New    Target Date  12/01/19      PT LONG TERM GOAL #2   Title  Pt to report decreased pain in [name], to 0-2/10 with elevation, reaching, and weight bearing activity    Time  6    Period  Weeks    Status  New  Target Date  12/01/19      PT LONG TERM GOAL #3   Title  Pt to demo improved strength of [name] to 5/5 for all motions    Time  6    Period  Weeks    Status  New    Target Date  12/01/19      PT LONG TERM GOAL #4   Title  Pt to demo full ROM for all [name] motions, including IR behind back, to be WNL, to improve ability for IADLS and work duties.    Time  6    Period  Weeks    Status  New    Target Date  12/01/19             Plan - 10/21/19 1056    Clinical Impression Statement  Pt presents with primary complaint of increased pain in [name]. Pt with ROM loss for elevation and  mostly Abd. Strength loss for elevation and IR as well. Pt with decreased ability for full functional activities, IADLs and work duties. Pt to benefit from skilled PT to improve deficits and pain.    Examination-Activity Limitations  Reach Overhead;Lift    Examination-Participation Restrictions  Cleaning;Community Activity;Driving;Yard Work    Stability/Clinical Decision Making  Stable/Uncomplicated    Clinical Decision Making  Low    Rehab Potential  Good    PT Frequency  2x / week    PT Duration  6 weeks    PT Treatment/Interventions  ADLs/Self Care Home Management;Cryotherapy;Electrical Stimulation;DME Instruction;Ultrasound;Traction;Moist Heat;Iontophoresis 4mg /ml Dexamethasone;Stair training;Functional mobility training;Therapeutic activities;Therapeutic exercise;Neuromuscular re-education;Manual techniques;Patient/family education;Dry needling;Passive range of motion;Energy conservation;Taping;Vasopneumatic Device;Spinal Manipulations;Joint Manipulations    Consulted and Agree with Plan of Care  Patient       Patient will benefit from skilled therapeutic intervention in order to improve the following deficits and impairments:  Decreased range of motion, Impaired UE functional use, Increased muscle spasms, Pain, Decreased activity tolerance, Improper body mechanics, Decreased strength, Decreased mobility, Impaired flexibility  Visit Diagnosis: Acute pain of left shoulder     Problem List Patient Active Problem List   Diagnosis Date Noted  . Alcohol consuption of more than two drinks per day   . Anxiety   . Depression   . Obsessive-compulsive disorder 12/17/2017  . Generalized anxiety disorder 12/17/2017    Lyndee Hensen, PT, DPT 11:49 AM  10/21/19    [facility] [place], [place], [address] Phone: [phone]   Fax:  [phone]  Name: Marsha Hillman MRN: [id number] Date of Birth: 09-09-1976

## 2019-10-21 NOTE — Patient Instructions (Signed)
Reviewed for correct mechanics, pt given HEP from MD:  Rows   GTB  IR/ER  GTB Horiz Abd (T)  RTB Low Row(I)  RTB Supine shoulder flexion / AAROM / cane   All done x10 today for review of correct mechaincs.

## 2019-10-25 ENCOUNTER — Ambulatory Visit (INDEPENDENT_AMBULATORY_CARE_PROVIDER_SITE_OTHER): Payer: 59 | Admitting: Physical Therapy

## 2019-10-25 ENCOUNTER — Encounter: Payer: Self-pay | Admitting: Physical Therapy

## 2019-10-25 ENCOUNTER — Other Ambulatory Visit: Payer: Self-pay

## 2019-10-25 ENCOUNTER — Ambulatory Visit (INDEPENDENT_AMBULATORY_CARE_PROVIDER_SITE_OTHER): Payer: 59 | Admitting: Psychology

## 2019-10-25 DIAGNOSIS — M25512 Pain in left shoulder: Secondary | ICD-10-CM

## 2019-10-25 DIAGNOSIS — F4321 Adjustment disorder with depressed mood: Secondary | ICD-10-CM

## 2019-10-26 NOTE — Therapy (Signed)
[facility] [address] [place], [place], [address] Phone: [phone]   Fax:  [phone]  Physical Therapy Treatment  Patient Details  Name: Chase Nunez MRN: [id number] Date of Birth: [birth year] Referring Provider (PT): Clementeen Graham   Encounter Date: 10/25/2019  PT End of Session - 10/25/19 1521    Visit Number  2    Number of Visits  12    Date for PT Re-Evaluation  12/01/19    Authorization Type  [facility]    PT Start Time  1515    PT Stop Time  1600    PT Time Calculation (min)  45 min    Activity Tolerance  Patient tolerated treatment well    Behavior During Therapy  [facility] for tasks assessed/performed       Past Medical History:  Diagnosis Date  . Alcohol consuption of more than two drinks per day   . Anxiety   . Arthritis   . Depression   . H/O drug abuse (HCC)    none per patient since age 53  . History of chicken pox     Past Surgical History:  Procedure Laterality Date  . SHOULDER ARTHROSCOPY      There were no vitals filed for this visit.  Subjective Assessment - 10/25/19 1520    Subjective  Pt no longer taking prednisone, has not noticed increased pain with this. States mild pain in shoulder, but has been trying to limit activity.    Currently in Pain?  Yes    Pain Score  5     Pain Location  Shoulder    Pain Orientation  Left    Pain Descriptors / Indicators  Aching    Pain Type  Acute pain    Pain Onset  1 to 4 weeks ago    Pain Frequency  Intermittent                       [facility] Adult PT Treatment/Exercise - 10/25/19 1522      Posture/Postural Control   Posture Comments  Rounded shoulders bil;       Exercises   Exercises  Shoulder      Shoulder Exercises: Supine   Horizontal ABduction  20 reps    Theraband Level (Shoulder Horizontal ABduction)  Level 2 (Red)    Horizontal ABduction Limitations  scap pull outs    External Rotation  20 reps    External Rotation Weight (lbs)  2       Shoulder Exercises: Standing   External Rotation  20 reps    Theraband Level (Shoulder External Rotation)  Level 3 (Green)    Internal Rotation  20 reps    Theraband Level (Shoulder Internal Rotation)  Level 3 (Green)    Extension  10 reps    Theraband Level (Shoulder Extension)  Level 2 (Red)    Extension Limitations  Low Row    Row  20 reps    Theraband Level (Shoulder Row)  Level 3 (Green)    Retraction  --    Theraband Level (Shoulder Retraction)  --    Retraction Limitations  --    Other Standing Exercises  UE lift offs/ at wall, x20 ;       Shoulder Exercises: Pulleys   ABduction  1 minute      Shoulder Exercises: ROM/Strengthening   UBE (Upper Arm Bike)  Fluid Cycle 5 min, fwd/bwd      Shoulder Exercises: Stretch  Other Shoulder Stretches  Supine, cane/ AAROM x10       Manual Therapy   Manual Therapy  Joint mobilization;Passive ROM    Manual therapy comments  skilled palpation and monitoring of soft tissue during dry needling     Passive ROM   L GHJ all motions       Trigger Point Dry Needling - 10/26/19 0001    Consent Given?  Yes    Education Handout Provided  Yes    Muscles Treated Head and Neck  Upper trapezius;Levator scapulae   L   Upper Trapezius Response  Twitch reponse elicited;Palpable increased muscle length    Levator Scapulae Response  Twitch response elicited;Palpable increased muscle length             PT Short Term Goals - 10/21/19 1053      PT SHORT TERM GOAL #1   Title  Pt to be independent with initial HEP    Time  2    Period  Weeks    Status  New    Target Date  11/03/19      PT SHORT TERM GOAL #2   Title  Pt to demo improved abd AROM by at least 10 deg.    Time  2    Period  Weeks    Status  New    Target Date  11/03/19        PT Long Term Goals - 10/21/19 1055      PT LONG TERM GOAL #1   Title  Pt to be independent with final HEP    Time  6    Period  Weeks    Status  New    Target Date  12/01/19      PT  LONG TERM GOAL #2   Title  Pt to report decreased pain in [name], to 0-2/10 with elevation, reaching, and weight bearing activity    Time  6    Period  Weeks    Status  New    Target Date  12/01/19      PT LONG TERM GOAL #3   Title  Pt to demo improved strength of [name] to 5/5 for all motions    Time  6    Period  Weeks    Status  New    Target Date  12/01/19      PT LONG TERM GOAL #4   Title  Pt to demo full ROM for all [name] motions, including IR behind back, to be WNL, to improve ability for IADLS and work duties.    Time  6    Period  Weeks    Status  New    Target Date  12/01/19            Plan - 10/26/19 0835    Clinical Impression Statement  Pt with good ability for ther ex and strengthening today. L UT and levator with siginicant hypertonicity , addressed with DN today,pt with good response. Pt with improved ability for ABd today. Plan to progress as tolerated.    Examination-Activity Limitations  Reach Overhead;Lift    Examination-Participation Restrictions  Cleaning;Community Activity;Driving;Yard Work    Stability/Clinical Decision Making  Stable/Uncomplicated    Rehab Potential  Good    PT Frequency  2x / week    PT Duration  6 weeks    PT Treatment/Interventions  ADLs/Self Care Home Management;Cryotherapy;Electrical Stimulation;DME Instruction;Ultrasound;Traction;Moist Heat;Iontophoresis 4mg /ml Dexamethasone;Stair training;Functional mobility training;Therapeutic activities;Therapeutic exercise;Neuromuscular re-education;Manual techniques;Patient/family education;Dry  needling;Passive range of motion;Energy conservation;Taping;Vasopneumatic Device;Spinal Manipulations;Joint Manipulations    Consulted and Agree with Plan of Care  Patient       Patient will benefit from skilled therapeutic intervention in order to improve the following deficits and impairments:  Decreased range of motion, Impaired UE functional use, Increased muscle spasms, Pain,  Decreased activity tolerance, Improper body mechanics, Decreased strength, Decreased mobility, Impaired flexibility  Visit Diagnosis: Acute pain of left shoulder     Problem List Patient Active Problem List   Diagnosis Date Noted  . Alcohol consuption of more than two drinks per day   . Anxiety   . Depression   . Obsessive-compulsive disorder 12/17/2017  . Generalized anxiety disorder 12/17/2017   Sedalia MutaLauren Kallen Delatorre, PT, DPT 8:38 AM  10/26/19    [facility] [address] [place], [place], [address] Phone: [phone]   Fax:  [phone]  Name: Chase CosierJames Nunez MRN: [id number] Date of Birth: 10/21/1976

## 2019-10-27 ENCOUNTER — Encounter: Payer: Self-pay | Admitting: Physical Therapy

## 2019-10-27 ENCOUNTER — Ambulatory Visit (INDEPENDENT_AMBULATORY_CARE_PROVIDER_SITE_OTHER): Payer: 59 | Admitting: Physical Therapy

## 2019-10-27 ENCOUNTER — Other Ambulatory Visit: Payer: Self-pay

## 2019-10-27 DIAGNOSIS — M25512 Pain in left shoulder: Secondary | ICD-10-CM

## 2019-10-27 NOTE — Therapy (Signed)
[facility] [address] [place], [place], [address] Phone: [phone]   Fax:  [phone]  Physical Therapy Treatment  Patient Details  Name: Chase Nunez MRN: [id number] Date of Birth: November 26, 1976 Referring Provider (PT): Clementeen Graham   Encounter Date: 10/27/2019  PT End of Session - 10/27/19 0935    Visit Number  3    Number of Visits  12    Date for PT Re-Evaluation  12/01/19    Authorization Type  [facility]    PT Start Time  0930    PT Stop Time  1012    PT Time Calculation (min)  42 min    Activity Tolerance  Patient tolerated treatment well    Behavior During Therapy  [employer] for tasks assessed/performed       Past Medical History:  Diagnosis Date  . Alcohol consuption of more than two drinks per day   . Anxiety   . Arthritis   . Depression   . H/O drug abuse (HCC)    none per patient since age 59  . History of chicken pox     Past Surgical History:  Procedure Laterality Date  . SHOULDER ARTHROSCOPY      There were no vitals filed for this visit.  Subjective Assessment - 10/27/19 0934    Subjective  Pt states minimal soreness in trap from DN. Shoulder still sore.    Currently in Pain?  Yes    Pain Score  4     Pain Location  Shoulder    Pain Orientation  Left    Pain Descriptors / Indicators  Aching    Pain Type  Acute pain    Pain Onset  1 to 4 weeks ago    Pain Frequency  Intermittent                       [facility] Adult PT Treatment/Exercise - 10/27/19 0939      Posture/Postural Control   Posture Comments  Rounded shoulders bil;       Exercises   Exercises  Shoulder      Shoulder Exercises: Supine   Horizontal ABduction  --    Theraband Level (Shoulder Horizontal ABduction)  --    Horizontal ABduction Limitations  --    External Rotation  20 reps    External Rotation Weight (lbs)  2    Other Supine Exercises  SA punch x20, 3 lb,   Horiz abd x20, 2lb , Flexion 2lb x15;       Shoulder  Exercises: Sidelying   ABduction  15 reps    ABduction Limitations  small range ROm    Other Sidelying Exercises  Horiz abd 2 lb x20      Shoulder Exercises: Standing   External Rotation  20 reps    Theraband Level (Shoulder External Rotation)  --    External Rotation Limitations  Isometric    Internal Rotation  20 reps    Theraband Level (Shoulder Internal Rotation)  Level 3 (Green)    Extension  20 reps    Theraband Level (Shoulder Extension)  Level 2 (Red)    Extension Limitations  Low Row    Row  20 reps    Theraband Level (Shoulder Row)  Level 3 (Green)    Other Standing Exercises  --      Shoulder Exercises: Pulleys   ABduction  1 minute      Shoulder Exercises: ROM/Strengthening   UBE (  Upper Arm Bike)  Fluid Cycle 5 min, fwd/bwd      Shoulder Exercises: Stretch   Other Shoulder Stretches  --      Manual Therapy   Manual Therapy  Joint mobilization;Passive ROM    Manual therapy comments  --    Joint Mobilization  PA and Inf mobs gr 3     Passive ROM   L GHJ all motions               PT Short Term Goals - 10/21/19 1053      PT SHORT TERM GOAL #1   Title  Pt to be independent with initial HEP    Time  2    Period  Weeks    Status  New    Target Date  11/03/19      PT SHORT TERM GOAL #2   Title  Pt to demo improved abd AROM by at least 10 deg.    Time  2    Period  Weeks    Status  New    Target Date  11/03/19        PT Long Term Goals - 10/21/19 1055      PT LONG TERM GOAL #1   Title  Pt to be independent with final HEP    Time  6    Period  Weeks    Status  New    Target Date  12/01/19      PT LONG TERM GOAL #2   Title  Pt to report decreased pain in [name], to 0-2/10 with elevation, reaching, and weight bearing activity    Time  6    Period  Weeks    Status  New    Target Date  12/01/19      PT LONG TERM GOAL #3   Title  Pt to demo improved strength of [name] to 5/5 for all motions    Time  6    Period  Weeks    Status   New    Target Date  12/01/19      PT LONG TERM GOAL #4   Title  Pt to demo full ROM for all [name] motions, including IR behind back, to be WNL, to improve ability for IADLS and work duties.    Time  6    Period  Weeks    Status  New    Target Date  12/01/19            Plan - 10/27/19 1017    Clinical Impression Statement  Pt with increased weakness in ER vs IR. Compensation from UT seen with attempts for strengtheing/tbands in standing for ER. Reduced to Isometric today and in supine, for correct mechanics. Noted instability with supine strengthening today. Minimal pain with ther ex and strengthening today, plan to progress as tolerated.    Examination-Activity Limitations  Reach Overhead;Lift    Examination-Participation Restrictions  Cleaning;Community Activity;Driving;Yard Work    Stability/Clinical Decision Making  Stable/Uncomplicated    Rehab Potential  Good    PT Frequency  2x / week    PT Duration  6 weeks    PT Treatment/Interventions  ADLs/Self Care Home Management;Cryotherapy;Electrical Stimulation;DME Instruction;Ultrasound;Traction;Moist Heat;Iontophoresis 4mg /ml Dexamethasone;Stair training;Functional mobility training;Therapeutic activities;Therapeutic exercise;Neuromuscular re-education;Manual techniques;Patient/family education;Dry needling;Passive range of motion;Energy conservation;Taping;Vasopneumatic Device;Spinal Manipulations;Joint Manipulations    Consulted and Agree with Plan of Care  Patient       Patient will benefit from skilled therapeutic intervention in order to  improve the following deficits and impairments:  Decreased range of motion, Impaired UE functional use, Increased muscle spasms, Pain, Decreased activity tolerance, Improper body mechanics, Decreased strength, Decreased mobility, Impaired flexibility  Visit Diagnosis: Acute pain of left shoulder     Problem List Patient Active Problem List   Diagnosis Date Noted  . Alcohol  consuption of more than two drinks per day   . Anxiety   . Depression   . Obsessive-compulsive disorder 12/17/2017  . Generalized anxiety disorder 12/17/2017    Sedalia MutaLauren Carvell Hoeffner, PT, DPT 10:19 AM  10/27/19    [facility] [address] [place], [place], [address] Phone: [phone]   Fax:  [phone]  Name: Chase Nunez MRN: [id number] Date of Birth: 07/28/1976

## 2019-10-29 ENCOUNTER — Other Ambulatory Visit: Payer: Self-pay

## 2019-11-01 ENCOUNTER — Other Ambulatory Visit: Payer: Self-pay

## 2019-11-01 ENCOUNTER — Ambulatory Visit (INDEPENDENT_AMBULATORY_CARE_PROVIDER_SITE_OTHER): Payer: 59 | Admitting: Physical Therapy

## 2019-11-01 ENCOUNTER — Encounter: Payer: Self-pay | Admitting: Physical Therapy

## 2019-11-01 DIAGNOSIS — M25512 Pain in left shoulder: Secondary | ICD-10-CM

## 2019-11-01 NOTE — Patient Instructions (Signed)
Access Code: [email or web address]  URL: [email or web address]  Date: 11/01/2019  Prepared by: Lyndee Hensen   Exercises Prone Scapular Retraction Arms at Side - 10 reps - 2 sets - 1x daily Prone Scapular Slide with Shoulder Extension - 10 reps - 2 sets - 1x daily Prone Single Arm Shoulder Y - 10 reps - 2 sets - 1x daily Supine Single Arm Shoulder External Rotation AROM - 10 reps - 2 sets - 1x daily Standing Shoulder External Rotation with Resistance - 10 reps - 2 sets - 1x daily Quadruped Scapular Protraction and Retraction - 10 reps - 2 sets - 1x daily

## 2019-11-01 NOTE — Therapy (Signed)
[facility] [address] [place], [place], [address] Phone: [phone]   Fax:  [phone]  Physical Therapy Treatment  Patient Details  Name: Chase CosierJames Nunez MRN: [id number] Date of Birth: 01/24/1976 Referring Provider (PT): Clementeen GrahamEvan Corey   Encounter Date: 11/01/2019  PT End of Session - 11/01/19 1700    Visit Number  4    Number of Visits  12    Date for PT Re-Evaluation  12/01/19    Authorization Type  [facility]    PT Start Time  1102    PT Stop Time  1143    PT Time Calculation (min)  41 min    Activity Tolerance  Patient tolerated treatment well    Behavior During Therapy  [facility] for tasks assessed/performed       Past Medical History:  Diagnosis Date  . Alcohol consuption of more than two drinks per day   . Anxiety   . Arthritis   . Depression   . H/O drug abuse (HCC)    none per patient since age 43  . History of chicken pox     Past Surgical History:  Procedure Laterality Date  . SHOULDER ARTHROSCOPY      There were no vitals filed for this visit.  Subjective Assessment - 11/01/19 1659    Subjective  Pt was grappling a few days ago, and heard loud "tearing" noise in [name]. Has not noticed significant increase in pain, but states "popping now"    Currently in Pain?  Yes    Pain Score  3     Pain Location  Shoulder    Pain Orientation  Left    Pain Descriptors / Indicators  Aching    Pain Type  Acute pain    Pain Onset  1 to 4 weeks ago    Pain Frequency  Intermittent                       [facility] Adult PT Treatment/Exercise - 11/01/19 1111      Posture/Postural Control   Posture Comments  Rounded shoulders bil;       Exercises   Exercises  Shoulder      Shoulder Exercises: Supine   External Rotation  20 reps    External Rotation Weight (lbs)  2    Other Supine Exercises  SA punch x20, 3 lb,   Horiz abd x20, 2lb , Flexion 2lb x15;       Shoulder Exercises: Prone   Other Prone Exercises   I, T, Y all x 15      Shoulder Exercises: Sidelying   ABduction  --    ABduction Limitations  --    Other Sidelying Exercises  Horiz abd 2 lb x20      Shoulder Exercises: Standing   External Rotation  20 reps    Theraband Level (Shoulder External Rotation)  Level 1 (Yellow)    External Rotation Limitations  --    Internal Rotation  20 reps    Theraband Level (Shoulder Internal Rotation)  Level 3 (Green)    Extension  --    Theraband Level (Shoulder Extension)  --    Extension Limitations  --    Row  20 reps    Theraband Level (Shoulder Row)  Level 3 (Green)    Other Standing Exercises  Scapular stab, square walk with YTB, and Rolls with noodle at wall.     Other Standing Exercises  UE  lift offs at wall x20       Shoulder Exercises: Pulleys   ABduction  --      Shoulder Exercises: ROM/Strengthening   UBE (Upper Arm Bike)  Fluid Cycle 5 min, fwd/bwd      Manual Therapy   Manual Therapy  Joint mobilization;Passive ROM    Joint Mobilization  PA and Inf mobs gr 3     Passive ROM   L GHJ all motions               PT Short Term Goals - 10/21/19 1053      PT SHORT TERM GOAL #1   Title  Pt to be independent with initial HEP    Time  2    Period  Weeks    Status  New    Target Date  11/03/19      PT SHORT TERM GOAL #2   Title  Pt to demo improved abd AROM by at least 10 deg.    Time  2    Period  Weeks    Status  New    Target Date  11/03/19        PT Long Term Goals - 10/21/19 1055      PT LONG TERM GOAL #1   Title  Pt to be independent with final HEP    Time  6    Period  Weeks    Status  New    Target Date  12/01/19      PT LONG TERM GOAL #2   Title  Pt to report decreased pain in [name], to 0-2/10 with elevation, reaching, and weight bearing activity    Time  6    Period  Weeks    Status  New    Target Date  12/01/19      PT LONG TERM GOAL #3   Title  Pt to demo improved strength of [name] to 5/5 for all motions    Time  6    Period   Weeks    Status  New    Target Date  12/01/19      PT LONG TERM GOAL #4   Title  Pt to demo full ROM for all [name] motions, including IR behind back, to be WNL, to improve ability for IADLS and work duties.    Time  6    Period  Weeks    Status  New    Target Date  12/01/19            Plan - 11/01/19 1700    Clinical Impression Statement  Pt with mild increase in pain with IR today than previous visits. Weakness and winging noted in [name] blade today, added prone strengthening for this. Pt with pain today during session with most ER motion and strengthening. Other motions strong, but pt most challenged with rotation and scapular strength today.    Examination-Activity Limitations  Reach Overhead;Lift    Examination-Participation Restrictions  Cleaning;Community Activity;Driving;Yard Work    Stability/Clinical Decision Making  Stable/Uncomplicated    Rehab Potential  Good    PT Frequency  2x / week    PT Duration  6 weeks    PT Treatment/Interventions  ADLs/Self Care Home Management;Cryotherapy;Electrical Stimulation;DME Instruction;Ultrasound;Traction;Moist Heat;Iontophoresis 4mg /ml Dexamethasone;Stair training;Functional mobility training;Therapeutic activities;Therapeutic exercise;Neuromuscular re-education;Manual techniques;Patient/family education;Dry needling;Passive range of motion;Energy conservation;Taping;Vasopneumatic Device;Spinal Manipulations;Joint Manipulations    Consulted and Agree with Plan of Care  Patient       Patient  will benefit from skilled therapeutic intervention in order to improve the following deficits and impairments:  Decreased range of motion, Impaired UE functional use, Increased muscle spasms, Pain, Decreased activity tolerance, Improper body mechanics, Decreased strength, Decreased mobility, Impaired flexibility  Visit Diagnosis: Acute pain of left shoulder     Problem List Patient Active Problem List   Diagnosis Date Noted  .  Alcohol consuption of more than two drinks per day   . Anxiety   . Depression   . Obsessive-compulsive disorder 12/17/2017  . Generalized anxiety disorder 12/17/2017   Sedalia Muta, PT, DPT 5:05 PM  11/01/19    [facility] [address] [place], [place], [address] Phone: [phone]   Fax:  [phone]  Name: Eliseo Withers MRN: [id number] Date of Birth: [birth year]

## 2019-11-03 ENCOUNTER — Other Ambulatory Visit: Payer: Self-pay

## 2019-11-03 ENCOUNTER — Encounter: Payer: Self-pay | Admitting: Physical Therapy

## 2019-11-03 ENCOUNTER — Ambulatory Visit (INDEPENDENT_AMBULATORY_CARE_PROVIDER_SITE_OTHER): Payer: 59 | Admitting: Physical Therapy

## 2019-11-03 DIAGNOSIS — M25512 Pain in left shoulder: Secondary | ICD-10-CM

## 2019-11-03 NOTE — Therapy (Signed)
[facility] [address] [place], [place], [address] Phone: [phone]   Fax:  [phone]  Physical Therapy Treatment  Patient Details  Name: Chase Nunez MRN: [id number] Date of Birth: Dec 28, 1975 Referring Provider (PT): Lynne Leader   Encounter Date: 11/03/2019  PT End of Session - 11/03/19 1418    Visit Number  5    Number of Visits  12    Date for PT Re-Evaluation  12/01/19    Authorization Type  [facility]    PT Start Time  0933    PT Stop Time  1012    PT Time Calculation (min)  39 min    Activity Tolerance  Patient tolerated treatment well    Behavior During Therapy  [facility] for tasks assessed/performed       Past Medical History:  Diagnosis Date  . Alcohol consuption of more than two drinks per day   . Anxiety   . Arthritis   . Depression   . H/O drug abuse ([place])    none per patient since age 73  . History of chicken pox     Past Surgical History:  Procedure Laterality Date  . SHOULDER ARTHROSCOPY      There were no vitals filed for this visit.  Subjective Assessment - 11/03/19 1417    Subjective  Pt reports doing HEP. He states no additional pain from incident earlier this week.    Currently in Pain?  Yes    Pain Score  3     Pain Location  Shoulder    Pain Orientation  Left    Pain Descriptors / Indicators  Aching;Dull    Pain Type  Acute pain    Pain Onset  More than a month ago    Pain Frequency  Intermittent                       [facility] Adult PT Treatment/Exercise - 11/03/19 0938      Posture/Postural Control   Posture Comments  Rounded shoulders bil;       Exercises   Exercises  Shoulder      Shoulder Exercises: Supine   External Rotation  20 reps    External Rotation Weight (lbs)  2    Flexion  AROM;20 reps    Other Supine Exercises  SA punch x20, 3 lb,   Horiz abd x20, 2lb , Flexion 2lb x15;       Shoulder Exercises: Prone   Other Prone Exercises  I, T, Y all x 15 on PBall no  weight       Shoulder Exercises: Sidelying   Other Sidelying Exercises  Horiz abd 2 lb x20      Shoulder Exercises: Standing   External Rotation  20 reps    Theraband Level (Shoulder External Rotation)  Level 1 (Yellow)    Internal Rotation  20 reps    Theraband Level (Shoulder Internal Rotation)  Level 3 (Green)    Row  --    Theraband Level (Shoulder Row)  --    Other Standing Exercises  Scapular stab, square walk with RTB,  ER at 90/90 AROM x15;     Other Standing Exercises  UE lift offs at wall x20       Shoulder Exercises: ROM/Strengthening   UBE (Upper Arm Bike)  Fluid Cycle 5 min, fwd/bwd      Shoulder Exercises: Body Blade   External Rotation  30 seconds;3 reps  Manual Therapy   Manual Therapy  Joint mobilization;Passive ROM    Joint Mobilization  Scap mobs with shoulder flexion     Passive ROM   L GHJ all motions               PT Short Term Goals - 10/21/19 1053      PT SHORT TERM GOAL #1   Title  Pt to be independent with initial HEP    Time  2    Period  Weeks    Status  New    Target Date  11/03/19      PT SHORT TERM GOAL #2   Title  Pt to demo improved abd AROM by at least 10 deg.    Time  2    Period  Weeks    Status  New    Target Date  11/03/19        PT Long Term Goals - 10/21/19 1055      PT LONG TERM GOAL #1   Title  Pt to be independent with final HEP    Time  6    Period  Weeks    Status  New    Target Date  12/01/19      PT LONG TERM GOAL #2   Title  Pt to report decreased pain in [name], to 0-2/10 with elevation, reaching, and weight bearing activity    Time  6    Period  Weeks    Status  New    Target Date  12/01/19      PT LONG TERM GOAL #3   Title  Pt to demo improved strength of [name] to 5/5 for all motions    Time  6    Period  Weeks    Status  New    Target Date  12/01/19      PT LONG TERM GOAL #4   Title  Pt to demo full ROM for all [name] motions, including IR behind back, to be WNL, to  improve ability for IADLS and work duties.    Time  6    Period  Weeks    Status  New    Target Date  12/01/19            Plan - 11/03/19 1418    Clinical Impression Statement  Pt with improved ability for scapular strengthening today. Compensation still seen with ER strengthening due to weakness. Plan to progress strength as tolerated.    Examination-Activity Limitations  Reach Overhead;Lift    Examination-Participation Restrictions  Cleaning;Community Activity;Driving;Yard Work    Stability/Clinical Decision Making  Stable/Uncomplicated    Rehab Potential  Good    PT Frequency  2x / week    PT Duration  6 weeks    PT Treatment/Interventions  ADLs/Self Care Home Management;Cryotherapy;Electrical Stimulation;DME Instruction;Ultrasound;Traction;Moist Heat;Iontophoresis 4mg /ml Dexamethasone;Stair training;Functional mobility training;Therapeutic activities;Therapeutic exercise;Neuromuscular re-education;Manual techniques;Patient/family education;Dry needling;Passive range of motion;Energy conservation;Taping;Vasopneumatic Device;Spinal Manipulations;Joint Manipulations    Consulted and Agree with Plan of Care  Patient       Patient will benefit from skilled therapeutic intervention in order to improve the following deficits and impairments:  Decreased range of motion, Impaired UE functional use, Increased muscle spasms, Pain, Decreased activity tolerance, Improper body mechanics, Decreased strength, Decreased mobility, Impaired flexibility  Visit Diagnosis: Acute pain of left shoulder     Problem List Patient Active Problem List   Diagnosis Date Noted  . Alcohol consuption of more than two drinks per  day   . Anxiety   . Depression   . Obsessive-compulsive disorder 12/17/2017  . Generalized anxiety disorder 12/17/2017    Sedalia MutaLauren Yasemin Rabon, PT, DPT 2:19 PM  11/03/19    [facility] [address] [place], [place], [address] Phone:  [phone]   Fax:  [phone]  Name: Chase CosierJames Nunez MRN: [id number] Date of Birth: 05/07/1976

## 2019-11-08 ENCOUNTER — Ambulatory Visit (INDEPENDENT_AMBULATORY_CARE_PROVIDER_SITE_OTHER): Payer: 59 | Admitting: Physical Therapy

## 2019-11-08 ENCOUNTER — Encounter: Payer: Self-pay | Admitting: Physical Therapy

## 2019-11-08 DIAGNOSIS — M25512 Pain in left shoulder: Secondary | ICD-10-CM

## 2019-11-09 ENCOUNTER — Other Ambulatory Visit: Payer: Self-pay

## 2019-11-10 ENCOUNTER — Ambulatory Visit (INDEPENDENT_AMBULATORY_CARE_PROVIDER_SITE_OTHER): Payer: 59 | Admitting: Family Medicine

## 2019-11-10 ENCOUNTER — Ambulatory Visit (INDEPENDENT_AMBULATORY_CARE_PROVIDER_SITE_OTHER): Payer: 59 | Admitting: Physical Therapy

## 2019-11-10 ENCOUNTER — Ambulatory Visit: Payer: Self-pay

## 2019-11-10 ENCOUNTER — Ambulatory Visit: Payer: 59 | Admitting: Family Medicine

## 2019-11-10 ENCOUNTER — Encounter: Payer: Self-pay | Admitting: Physical Therapy

## 2019-11-10 ENCOUNTER — Encounter: Payer: Self-pay | Admitting: Family Medicine

## 2019-11-10 VITALS — BP 142/88 | HR 77 | Ht 66.0 in | Wt 186.8 lb

## 2019-11-10 DIAGNOSIS — M25512 Pain in left shoulder: Secondary | ICD-10-CM

## 2019-11-10 NOTE — Patient Instructions (Signed)
Thank you for coming in today. Continue home exercises and limited PT.  Let me know how that shoulder feels in Jan.  Next step is MRI arthrogram.   We're moving!  Dr. Clovis Riley new office will be located at [address] on the 1st floor.  This location is across the street from the [employer] and in the same complex as the [facility] and [employer]  Our new office phone number will be [phone].  We anticipate beginning to see patients at the [facility] office in early December 2020.  Call or go to the ER if you develop a large red swollen joint with extreme pain or oozing puss.

## 2019-11-10 NOTE — Therapy (Signed)
[facility] [address] [place], [place], [address] Phone: [phone]   Fax:  [phone]  Physical Therapy Treatment  Patient Details  Name: Chase Nunez MRN: [id number] Date of Birth: March 29, 1976 Referring Provider (PT): Clementeen Graham   Encounter Date: 11/10/2019  PT End of Session - 11/10/19 1109    Visit Number  7    Number of Visits  12    Date for PT Re-Evaluation  12/01/19    Authorization Type  [facility]    PT Start Time  1102    PT Stop Time  1130    PT Time Calculation (min)  28 min    Activity Tolerance  Patient tolerated treatment well    Behavior During Therapy  [facility] for tasks assessed/performed       Past Medical History:  Diagnosis Date  . Alcohol consuption of more than two drinks per day   . Anxiety   . Arthritis   . Depression   . H/O drug abuse ([facility])    none per patient since age 76  . History of chicken pox     Past Surgical History:  Procedure Laterality Date  . SHOULDER ARTHROSCOPY      There were no vitals filed for this visit.  Subjective Assessment - 11/10/19 1108    Subjective  Pt had MD appt today, had injection.    Currently in Pain?  Yes    Pain Score  4     Pain Location  Shoulder    Pain Orientation  Left    Pain Descriptors / Indicators  Aching    Pain Type  Acute pain    Pain Onset  More than a month ago    Pain Frequency  Intermittent                       [facility] Adult PT Treatment/Exercise - 11/10/19 1107      Posture/Postural Control   Posture Comments  Rounded shoulders bil;       Exercises   Exercises  Shoulder      Shoulder Exercises: Supine   External Rotation  --    External Rotation Weight (lbs)  --    External Rotation Limitations  --    Flexion  20 reps    Flexion Limitations  AAROM, cane     Other Supine Exercises    Horiz abd x20, 2lb ,    Other Supine Exercises  --      Shoulder Exercises: Prone   Other Prone Exercises  I, T, Y all x 15 on  PBall no weight     Other Prone Exercises  Discussed SA presses in quadruped for HEP       Shoulder Exercises: Standing   External Rotation  --    Theraband Level (Shoulder External Rotation)  --    Internal Rotation  --    Theraband Level (Shoulder Internal Rotation)  --    Row  20 reps    Theraband Level (Shoulder Row)  Level 4 (Blue)    Other Standing Exercises  Rhythmic stabs, ball at wall x10 each;     Other Standing Exercises  Wall push ups x15;       Shoulder Exercises: ROM/Strengthening   UBE (Upper Arm Bike)  Fluid Cycle 3 min, fwd      Shoulder Exercises: Body Blade   Flexion  --    ABduction  --    External Rotation  --  Manual Therapy   Manual Therapy  Joint mobilization;Passive ROM    Joint Mobilization  --    Passive ROM   L GHJ all motions               PT Short Term Goals - 11/10/19 1110      PT SHORT TERM GOAL #1   Title  Pt to be independent with initial HEP    Time  2    Period  Weeks    Status  Achieved    Target Date  11/03/19      PT SHORT TERM GOAL #2   Title  Pt to demo improved abd AROM by at least 10 deg.    Time  2    Period  Weeks    Status  Achieved    Target Date  11/03/19        PT Long Term Goals - 10/21/19 1055      PT LONG TERM GOAL #1   Title  Pt to be independent with final HEP    Time  6    Period  Weeks    Status  New    Target Date  12/01/19      PT LONG TERM GOAL #2   Title  Pt to report decreased pain in [name], to 0-2/10 with elevation, reaching, and weight bearing activity    Time  6    Period  Weeks    Status  New    Target Date  12/01/19      PT LONG TERM GOAL #3   Title  Pt to demo improved strength of [name] to 5/5 for all motions    Time  6    Period  Weeks    Status  New    Target Date  12/01/19      PT LONG TERM GOAL #4   Title  Pt to demo full ROM for all [name] motions, including IR behind back, to be WNL, to improve ability for IADLS and work duties.    Time  6     Period  Weeks    Status  New    Target Date  12/01/19            Plan - 11/10/19 1140    Clinical Impression Statement  Pt had injection today prior to PT apt. Decreased ther ex done today at request of Dr that did incision. Pt doing well with ther ex, plan to update HEP next visit. Discussed including rhythmic stabs and weight bearing exercises to HEP. Will decrease PT sessions to 1x next week, and make plan to continue from there depending on pain levels.    Examination-Activity Limitations  Reach Overhead;Lift    Examination-Participation Restrictions  Cleaning;Community Activity;Driving;Yard Work    Stability/Clinical Decision Making  Stable/Uncomplicated    Rehab Potential  Good    PT Frequency  2x / week    PT Duration  6 weeks    PT Treatment/Interventions  ADLs/Self Care Home Management;Cryotherapy;Electrical Stimulation;DME Instruction;Ultrasound;Traction;Moist Heat;Iontophoresis 4mg /ml Dexamethasone;Stair training;Functional mobility training;Therapeutic activities;Therapeutic exercise;Neuromuscular re-education;Manual techniques;Patient/family education;Dry needling;Passive range of motion;Energy conservation;Taping;Vasopneumatic Device;Spinal Manipulations;Joint Manipulations    Consulted and Agree with Plan of Care  Patient       Patient will benefit from skilled therapeutic intervention in order to improve the following deficits and impairments:  Decreased range of motion, Impaired UE functional use, Increased muscle spasms, Pain, Decreased activity tolerance, Improper body mechanics, Decreased strength, Decreased mobility, Impaired flexibility  Visit Diagnosis: Acute pain of left shoulder     Problem List Patient Active Problem List   Diagnosis Date Noted  . Alcohol consuption of more than two drinks per day   . Anxiety   . Depression   . Obsessive-compulsive disorder 12/17/2017  . Generalized anxiety disorder 12/17/2017    Chase Nunez, PT, DPT 11:42 AM   11/10/19    [facility] [place], [place], [address] Phone: [phone]   Fax:  [phone]  Name: Chase Nunez MRN: [id number] Date of Birth: [birth year]

## 2019-11-10 NOTE — Progress Notes (Signed)
I, Christoper Fabian, LAT, ATC, am serving as scribe for Dr. Clementeen Graham.  Chase Nunez is a 43 y.o. male who presents to [facility] today for f/u of [name] pain.  He was last seen by Dr. [name] on 10/13/19 and has completed 6 PT visits since his last visit.  Pt has completed a course of oral prednisone and has been using Voltaren gel but isn't sure if the Voltaren is helping.  Since his last visit, the pt notes that his [name] feels better but notes that he con't to feel weakness and has had to modify what he does at work.  His most problematic motion is ER w/ his elbow at his side and also trying to put his L arm into a jacket.  His pain on average is a 3-4/10.  Pt does report some clicking in his [name] that he did not notice previously and had an episode of a tearing-type sensation while doing a wrestling move.      ROS:  As above  Exam:  BP (!) 142/88 (BP Location: Left Arm, Patient Position: Sitting, Cuff Size: Normal)   Pulse 77   Ht 5\' 6"  (1.676 m)   Wt 186 lb 12.8 oz (84.7 kg)   SpO2 95%   BMI 30.15 kg/m  Wt Readings from Last 5 Encounters:  11/10/19 186 lb 12.8 oz (84.7 kg)  10/13/19 186 lb (84.4 kg)  09/08/19 185 lb (83.9 kg)  04/07/19 190 lb (86.2 kg)  03/05/19 188 lb (85.3 kg)   General: Well Developed, well nourished, and in no acute distress.  Neuro/Psych: Alert and oriented x3, extra-ocular muscles intact, able to move all 4 extremities, sensation grossly intact. Skin: Warm and dry, no rashes noted.  Respiratory: Not using accessory muscles, speaking in full sentences, trachea midline.  Cardiovascular: Pulses palpable, no extremity edema. Abdomen: Does not appear distended. MSK:  Left shoulder normal-appearing Normal motion. Nontender. Strength 4/5 abduction and external rotation.  5/5 internal rotation. Mildly positive Hawkins and Neer's test. Mildly positive O'Brien's test. Palpable click with motion.    Lab and Radiology Results  Procedure: Real-time Ultrasound Guided Injection of left shoulder subacromial bursa Device: Philips Affiniti 50G Images permanently stored and available for review in the ultrasound unit. Verbal informed consent obtained.  Discussed risks and benefits of procedure. Warned about infection bleeding damage to structures skin hypopigmentation and fat atrophy among others. Patient expresses understanding and agreement Time-out conducted.   Noted no overlying erythema, induration, or other signs of local infection.   Skin prepped in a sterile fashion.   Local anesthesia: Topical Ethyl chloride.   With sterile technique and under real time ultrasound guidance:  40 mg of Kenalog and 2 mL of Marcaine injected easily.   Completed without difficulty   Pain immediately resolved suggesting accurate placement of the medication.   Advised to call if fevers/chills, erythema, induration, drainage, or persistent bleeding.   Images permanently stored and available for review in the ultrasound unit.  Impression: Technically successful ultrasound guided injection.         Assessment and Plan: 43 y.o. male with left shoulder pain.  Patient has had improvement with physical therapy but continues to have symptoms.  He does have some mechanical symptoms indicating possible labrum pathology and possible rotator cuff pathology.  After discussion plan for trial of subacromial injection and continued home exercise program/physical therapy.  If not better in 1 month we will proceed with MRI arthrogram for surgical planning.  PDMP not reviewed this encounter. Orders Placed This Encounter  Procedures  . No CHG - [place] upper Lt    Order Specific Question:   Reason for Exam (SYMPTOM  OR DIAGNOSIS REQUIRED)    Answer:   eval shoulder pain and inj    Order Specific Question:   Preferred imaging location?    Answer:   [facility]   No orders of the defined types were placed in this encounter.    Historical information moved to improve visibility of documentation.  Past Medical History:  Diagnosis Date  . Alcohol consuption of more than two drinks per day   . Anxiety   . Arthritis   . Depression   . H/O drug abuse ([place])    none per patient since age 70  . History of chicken pox    Past Surgical History:  Procedure Laterality Date  . SHOULDER ARTHROSCOPY     Social History   Tobacco Use  . Smoking status: Never Smoker  . Smokeless tobacco: Current User    Types: Snuff  Substance Use Topics  . Alcohol use: Yes    Alcohol/week: 10.0 standard drinks    Types: 10 Shots of liquor per week   family history includes Alcohol abuse in his father; Arthritis in his father; Birth defects in his mother; COPD in his maternal grandfather and maternal grandmother; Diabetes in his paternal grandfather and paternal grandmother; Drug abuse in his father; Heart disease in his father, maternal grandfather, paternal grandfather, and paternal grandmother; High Cholesterol in his father; Hyperlipidemia in his maternal grandfather and paternal grandfather; Hypertension in his father; Kidney disease in his paternal grandmother; Miscarriages / [place] in his mother; Thyroid disease in his mother.  Medications: Current Outpatient Medications  Medication Sig Dispense Refill  . diclofenac sodium (VOLTAREN) 1 % GEL Apply 4 g topically 4 (four) times daily. To affected joint. 100 g 11  . FLUoxetine (PROZAC) 20 MG capsule Take 1 capsule (20 mg total) by mouth every morning. 90 capsule 0  . FLUoxetine (PROZAC) 40 MG capsule Take 1 capsule (40 mg total) by mouth daily. 90 capsule 0  . ibuprofen (ADVIL,MOTRIN) 200 MG tablet Take 600-800 mg by mouth as needed.    . Magnesium 500 MG CAPS Take 1 capsule by mouth daily.    . Multiple Vitamins-Minerals (MULTIVITAMIN WITH MINERALS) tablet Take 1 tablet by mouth daily.    . Omega-3 Fatty Acids (FISH OIL) 1000 MG CAPS Take by mouth.     No current  facility-administered medications for this visit.    No Known Allergies    Discussed warning signs or symptoms. Please see discharge instructions. Patient expresses understanding.  The above documentation has been reviewed and is accurate and complete Lynne Leader

## 2019-11-10 NOTE — Therapy (Signed)
[facility] [address] [place], [place], [address] Phone: [phone]   Fax:  [phone]  Physical Therapy Treatment  Patient Details  Name: Chase Nunez MRN: [id number] Date of Birth: 1975-12-23 Referring Provider (PT): Clementeen Graham   Encounter Date: 11/08/2019  PT End of Session - 11/10/19 1148    Visit Number  6    Number of Visits  12    Date for PT Re-Evaluation  12/01/19    Authorization Type  [facility]    PT Start Time  2878    PT Stop Time  1017    PT Time Calculation (min)  41 min    Activity Tolerance  Patient tolerated treatment well    Behavior During Therapy  [facility] for tasks assessed/performed       Past Medical History:  Diagnosis Date  . Alcohol consuption of more than two drinks per day   . Anxiety   . Arthritis   . Depression   . H/O drug abuse (HCC)    none per patient since age 24  . History of chicken pox     Past Surgical History:  Procedure Laterality Date  . SHOULDER ARTHROSCOPY      There were no vitals filed for this visit.  Subjective Assessment - 11/10/19 1147    Subjective  Pt with no new complaints. States pain is no worse, but feels like increased clicking and popping with activity . Sees MD for f/u this week.    Currently in Pain?  Yes    Pain Score  4     Pain Location  Shoulder    Pain Orientation  Left    Pain Descriptors / Indicators  Aching    Pain Type  Acute pain    Pain Onset  More than a month ago    Pain Frequency  Intermittent         [facility] PT Assessment - 11/10/19 1151      Posture/Postural Control   Posture Comments  Rounded shoulders bil;                    [facility] Adult PT Treatment/Exercise - 11/10/19 1151      Exercises   Exercises  Shoulder      Shoulder Exercises: Supine   External Rotation  20 reps    External Rotation Weight (lbs)  2    External Rotation Limitations  at 45 deg and 90/90.     Other Supine Exercises    Horiz abd x20, 2lb ,  Flexion 2lb x15;     Other Supine Exercises  Quick rhythmic stabs for rotation multi direction IR/ER with RTB ; manually assisted D1 and D2 in supine.       Shoulder Exercises: Standing   External Rotation  20 reps    Theraband Level (Shoulder External Rotation)  Level 1 (Yellow)    Internal Rotation  20 reps    Theraband Level (Shoulder Internal Rotation)  Level 3 (Green)    Row  20 reps    Theraband Level (Shoulder Row)  Level 4 (Blue)    Other Standing Exercises  Scapular stab, square walk with RTB,  ER at 90/90 YTB 2x10 quick;     Other Standing Exercises  UE lift offs at wall x20       Shoulder Exercises: ROM/Strengthening   UBE (Upper Arm Bike)  Fluid Cycle 5 min, fwd/bwd      Shoulder Exercises: Body Blade  Flexion  30 seconds;2 reps    ABduction  2 reps;30 seconds    External Rotation  30 seconds;3 reps      Manual Therapy   Manual Therapy  Joint mobilization;Passive ROM    Joint Mobilization  Scap mobs with shoulder flexion     Passive ROM   L GHJ all motions               PT Short Term Goals - 11/10/19 1110      PT SHORT TERM GOAL #1   Title  Pt to be independent with initial HEP    Time  2    Period  Weeks    Status  Achieved    Target Date  11/03/19      PT SHORT TERM GOAL #2   Title  Pt to demo improved abd AROM by at least 10 deg.    Time  2    Period  Weeks    Status  Achieved    Target Date  11/03/19        PT Long Term Goals - 10/21/19 1055      PT LONG TERM GOAL #1   Title  Pt to be independent with final HEP    Time  6    Period  Weeks    Status  New    Target Date  12/01/19      PT LONG TERM GOAL #2   Title  Pt to report decreased pain in [name], to 0-2/10 with elevation, reaching, and weight bearing activity    Time  6    Period  Weeks    Status  New    Target Date  12/01/19      PT LONG TERM GOAL #3   Title  Pt to demo improved strength of [name] to 5/5 for all motions    Time  6    Period  Weeks    Status  New     Target Date  12/01/19      PT LONG TERM GOAL #4   Title  Pt to demo full ROM for all [name] motions, including IR behind back, to be WNL, to improve ability for IADLS and work duties.    Time  6    Period  Weeks    Status  New    Target Date  12/01/19            Plan - 11/10/19 1149    Clinical Impression Statement  Pt with improved ROM and less compensation seen wtih resisted ER today. Pt diligent with HEP. Increased rhythmic stabs today, pt challenged with this, with fatigue in arm. Will continue to progress as able. He continues to have mild pain with ER and IR in session today.    Examination-Activity Limitations  Reach Overhead;Lift    Examination-Participation Restrictions  Cleaning;Community Activity;Driving;Yard Work    Stability/Clinical Decision Making  Stable/Uncomplicated    Rehab Potential  Good    PT Frequency  2x / week    PT Duration  6 weeks    PT Treatment/Interventions  ADLs/Self Care Home Management;Cryotherapy;Electrical Stimulation;DME Instruction;Ultrasound;Traction;Moist Heat;Iontophoresis 4mg /ml Dexamethasone;Stair training;Functional mobility training;Therapeutic activities;Therapeutic exercise;Neuromuscular re-education;Manual techniques;Patient/family education;Dry needling;Passive range of motion;Energy conservation;Taping;Vasopneumatic Device;Spinal Manipulations;Joint Manipulations    Consulted and Agree with Plan of Care  Patient       Patient will benefit from skilled therapeutic intervention in order to improve the following deficits and impairments:  Decreased range of motion, Impaired UE  functional use, Increased muscle spasms, Pain, Decreased activity tolerance, Improper body mechanics, Decreased strength, Decreased mobility, Impaired flexibility  Visit Diagnosis: Acute pain of left shoulder     Problem List Patient Active Problem List   Diagnosis Date Noted  . Alcohol consuption of more than two drinks per day   . Anxiety   .  Depression   . Obsessive-compulsive disorder 12/17/2017  . Generalized anxiety disorder 12/17/2017   Sedalia MutaLauren Adelena Desantiago, PT, DPT 11:53 AM  11/10/19    [facility] [address] [place], [place], [address] Phone: [phone]   Fax:  [phone]  Name: Chase CosierJames Nunez MRN: [id number] Date of Birth: 06/12/1976

## 2019-11-16 ENCOUNTER — Telehealth: Payer: Self-pay | Admitting: *Deleted

## 2019-11-16 NOTE — Telephone Encounter (Signed)
Left message on voicemail to call office. See if pt wants to see Dr. Georgina Snell for ribs.

## 2019-11-17 ENCOUNTER — Other Ambulatory Visit: Payer: Self-pay

## 2019-11-17 ENCOUNTER — Ambulatory Visit
Admission: RE | Admit: 2019-11-17 | Discharge: 2019-11-17 | Disposition: A | Discharge status: Home / Self Care | Payer: 59 | Source: Ambulatory Visit | Attending: Physician Assistant | Admitting: Physician Assistant

## 2019-11-17 ENCOUNTER — Telehealth: Payer: Self-pay | Admitting: *Deleted

## 2019-11-17 ENCOUNTER — Ambulatory Visit: Payer: 59

## 2019-11-17 ENCOUNTER — Ambulatory Visit (INDEPENDENT_AMBULATORY_CARE_PROVIDER_SITE_OTHER): Payer: 59

## 2019-11-17 ENCOUNTER — Ambulatory Visit (INDEPENDENT_AMBULATORY_CARE_PROVIDER_SITE_OTHER): Payer: 59 | Admitting: Physician Assistant

## 2019-11-17 ENCOUNTER — Other Ambulatory Visit: Payer: 59

## 2019-11-17 ENCOUNTER — Encounter: Payer: Self-pay | Admitting: Physician Assistant

## 2019-11-17 VITALS — BP 120/80 | HR 70 | Temp 98.0°F | Ht 66.0 in | Wt 183.5 lb

## 2019-11-17 DIAGNOSIS — R0781 Pleurodynia: Secondary | ICD-10-CM

## 2019-11-17 DIAGNOSIS — S299XXA Unspecified injury of thorax, initial encounter: Secondary | ICD-10-CM | POA: Diagnosis not present

## 2019-11-17 NOTE — Telephone Encounter (Signed)
Left message on voicemail to call office. Please schedule pt at [facility] office for repeat xray today if possible.

## 2019-11-17 NOTE — Telephone Encounter (Signed)
Pt called back and was scheduled by the front office.

## 2019-11-17 NOTE — Progress Notes (Signed)
Chase Nunez is a 43 y.o. male here for a new problem.  I acted as a Education administrator for [employer], PA-C Anselmo Pickler, LPN  History of Present Illness:   Chief Complaint  Patient presents with  . Rib Injury    HPI   Rib injury Pt c/o pain left rib area, was a wrestling practice on Sunday and was injured. Pt has been using 800 mg Motrin BID with some relief. Hurts with cough, laughing and movement. Is able to take deep breaths, but when he does, he has pain. Has had rib injuries in the past but none that have lingered as long as this in the past. Denies: SOB, visible deformity to rib, rash/erythema  Past Medical History:  Diagnosis Date  . Alcohol consuption of more than two drinks per day   . Anxiety   . Arthritis   . Depression   . H/O drug abuse ([place])    none per patient since age 37  . History of chicken pox      Social History   Socioeconomic History  . Marital status: Married    Spouse name: Kenney Houseman  . Number of children: 1  . Years of education: Not on file  . Highest education level: Not on file  Occupational History  . Not on file  Social Needs  . Financial resource strain: Not on file  . Food insecurity    Worry: Not on file    Inability: Not on file  . Transportation needs    Medical: Not on file    Non-medical: Not on file  Tobacco Use  . Smoking status: Never Smoker  . Smokeless tobacco: Current User    Types: Snuff  Substance and Sexual Activity  . Alcohol use: Yes    Alcohol/week: 10.0 standard drinks    Types: 10 Shots of liquor per week  . Drug use: No  . Sexual activity: Yes  Lifestyle  . Physical activity    Days per week: Not on file    Minutes per session: Not on file  . Stress: Not on file  Relationships  . Social Herbalist on phone: Not on file    Gets together: Not on file    Attends religious service: Not on file    Active member of club or organization: Not on file    Attends meetings of clubs or organizations: Not on  file    Relationship status: Not on file  . Intimate partner violence    Fear of current or ex partner: Not on file    Emotionally abused: Not on file    Physically abused: Not on file    Forced sexual activity: Not on file  Other Topics Concern  . 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 SHOULDER ARTHROSCOPY      Family History  Problem Relation Age of Onset  . Thyroid disease Mother   . Birth defects Mother   . Miscarriages / [place] Mother   . Heart disease Father   . Hypertension Father   . Alcohol abuse Father   . Arthritis Father   . Drug abuse Father   . High Cholesterol Father   . Heart disease Maternal Grandfather   . Hyperlipidemia Maternal Grandfather   . COPD Maternal Grandfather   .  Diabetes Paternal Grandmother   . Heart disease Paternal Grandmother   . Kidney disease Paternal Grandmother   . Heart disease Paternal Grandfather   . Diabetes Paternal Grandfather   . Hyperlipidemia Paternal Grandfather   . COPD Maternal Grandmother   . Colon cancer Neg Hx   . Prostate cancer Neg Hx     No Known Allergies  Current Medications:   Current Outpatient Medications:  .  diclofenac sodium (VOLTAREN) 1 % GEL, Apply 4 g topically 4 (four) times daily. To affected joint., Disp: 100 g, Rfl: 11 .  FLUoxetine (PROZAC) 40 MG capsule, Take 1 capsule (40 mg total) by mouth daily. (Patient taking differently: Take 80 mg by mouth daily. ), Disp: 90 capsule, Rfl: 0 .  ibuprofen (ADVIL,MOTRIN) 200 MG tablet, Take 600-800 mg by mouth as needed., Disp: , Rfl:  .  Magnesium 500 MG CAPS, Take 1 capsule by mouth daily., Disp: , Rfl:  .  Multiple Vitamins-Minerals (MULTIVITAMIN WITH MINERALS) tablet, Take 1 tablet by mouth daily., Disp: , Rfl:  .  Omega-3 Fatty Acids (FISH OIL) 1000 MG CAPS, Take by mouth., Disp: , Rfl:    Review of  Systems:   ROS  Vitals:   Vitals:   11/17/19 1042  BP: 120/80  Pulse: 70  Temp: 98 F (36.7 C)  TempSrc: Temporal  SpO2: 95%  Weight: 183 lb 8 oz (83.2 kg)  Height: 5\' 6"  (1.676 m)     Body mass index is 29.62 kg/m.  Physical Exam:   Physical Exam Vitals signs and nursing note reviewed.  Constitutional:      General: He is not in acute distress.    Appearance: He is well-developed. He is not ill-appearing or toxic-appearing.  Cardiovascular:     Rate and Rhythm: Normal rate and regular rhythm.     Pulses: Normal pulses.     Heart sounds: Normal heart sounds, S1 normal and S2 normal.     Comments: No LE edema Pulmonary:     Effort: Pulmonary effort is normal.     Breath sounds: Normal breath sounds.  Musculoskeletal:     Comments: TTP to L lateral lower rib cage; no palpable deformity; no skin changes  Skin:    General: Skin is warm and dry.  Neurological:     Mental Status: He is alert.     GCS: GCS eye subscore is 4. GCS verbal subscore is 5. GCS motor subscore is 6.  Psychiatric:        Speech: Speech normal.        Behavior: Behavior normal. Behavior is cooperative.      Assessment and Plan:   Antwione was seen today for rib injury.  Diagnoses and all orders for this visit:  Rib pain on left side -     DG Ribs Unilateral W/Chest Left; Future   Will obtain xray at [facility] as we are having technical difficulties at our location. No red flags on discussion or examination of patient. Recommended deep inspiration, continued NSAIDS, reviewed worsening precautions.  . Reviewed expectations re: course of current medical issues. . Discussed self-management of symptoms. . Outlined signs and symptoms indicating need for more acute intervention. . Patient verbalized understanding and all questions were answered. . See orders for this visit as documented in the electronic medical record. . Patient received an After-Visit Summary.  CMA or LPN served as  scribe during this visit. History, Physical, and Plan performed by medical provider. The above documentation has been reviewed  and is accurate and complete.  Jarold Motto, PA-C

## 2019-11-17 NOTE — Patient Instructions (Signed)
It was great to see you!  I will be in touch with your xray results.  Take care,  Carrieanne Kleen PA-C  

## 2019-11-18 ENCOUNTER — Ambulatory Visit (INDEPENDENT_AMBULATORY_CARE_PROVIDER_SITE_OTHER): Payer: 59 | Admitting: Physical Therapy

## 2019-11-18 ENCOUNTER — Encounter: Payer: Self-pay | Admitting: Physical Therapy

## 2019-11-18 DIAGNOSIS — M25512 Pain in left shoulder: Secondary | ICD-10-CM

## 2019-11-19 ENCOUNTER — Ambulatory Visit (INDEPENDENT_AMBULATORY_CARE_PROVIDER_SITE_OTHER): Payer: 59 | Admitting: Psychology

## 2019-11-19 DIAGNOSIS — F4321 Adjustment disorder with depressed mood: Secondary | ICD-10-CM

## 2019-11-21 ENCOUNTER — Encounter: Payer: Self-pay | Admitting: Physical Therapy

## 2019-11-21 NOTE — Therapy (Signed)
[facility] [address] [place], [place], [address] Phone: [phone]   Fax:  [phone]  Physical Therapy Treatment/Discharge   Patient Details  Name: Chase Nunez MRN: [id number] Date of Birth: [birth year] Referring Provider (PT): Lynne Leader   Encounter Date: 11/18/2019  PT End of Session - 11/21/19 1511    Visit Number  8    Number of Visits  12    Date for PT Re-Evaluation  12/01/19    Authorization Type  [facility]    PT Start Time  1101    PT Stop Time  1140    PT Time Calculation (min)  39 min    Activity Tolerance  Patient tolerated treatment well    Behavior During Therapy  [facility] for tasks assessed/performed       Past Medical History:  Diagnosis Date  . Alcohol consuption of more than two drinks per day   . Anxiety   . Arthritis   . Depression   . H/O drug abuse ([place])    none per patient since age 68  . History of chicken pox     Past Surgical History:  Procedure Laterality Date  . SHOULDER ARTHROSCOPY      There were no vitals filed for this visit.  Subjective Assessment - 11/21/19 1510    Subjective  Pt with increased rib pain on L, had xray yesterday, (negative for fracture). States pain in shoulder is improving. Has been able to do HEP    Currently in Pain?  No/denies    Pain Score  0-No pain    Pain Onset  More than a month ago                       [facility] Adult PT Treatment/Exercise - 11/21/19 0001      Posture/Postural Control   Posture Comments  Rounded shoulders bil;       Exercises   Exercises  Shoulder      Shoulder Exercises: Supine   External Rotation  20 reps    External Rotation Weight (lbs)  3    External Rotation Limitations  at 45 deg     Other Supine Exercises    Horiz abd x20, 2lb , Flexion 2lb x15;     Other Supine Exercises  Quick rhythmic stabs for rotation IR/ER with RTB ; .       Shoulder Exercises: Prone   Other Prone Exercises  High plank x45 sec;  High  plank Serratus press x10;  Serratus press on knees x10;       Shoulder Exercises: Standing   External Rotation  20 reps    Theraband Level (Shoulder External Rotation)  Level 1 (Yellow)    Internal Rotation  20 reps    Theraband Level (Shoulder Internal Rotation)  Level 3 (Green)    Row  20 reps    Theraband Level (Shoulder Row)  Level 4 (Blue)    Other Standing Exercises   ER at 90/90 YTB 2x10       Shoulder Exercises: ROM/Strengthening   UBE (Upper Arm Bike)  not done due to rib pain       Shoulder Exercises: Body Blade   Flexion  30 seconds;2 reps    ABduction  2 reps;30 seconds    External Rotation  30 seconds;3 reps      Manual Therapy   Manual Therapy  Joint mobilization;Passive ROM  PT Education - 11/21/19 1510    Education Details  Final HEP reviewed in detail    Person(s) Educated  Patient    Methods  Explanation;Demonstration    Comprehension  Verbalized understanding;Returned demonstration       PT Short Term Goals - 11/10/19 1110      PT SHORT TERM GOAL #1   Title  Pt to be independent with initial HEP    Time  2    Period  Weeks    Status  Achieved    Target Date  11/03/19      PT SHORT TERM GOAL #2   Title  Pt to demo improved abd AROM by at least 10 deg.    Time  2    Period  Weeks    Status  Achieved    Target Date  11/03/19        PT Long Term Goals - 11/21/19 1511      PT LONG TERM GOAL #1   Title  Pt to be independent with final HEP    Time  6    Period  Weeks    Status  Achieved      PT LONG TERM GOAL #2   Title  Pt to report decreased pain in [name], to 0-2/10 with elevation, reaching, and weight bearing activity    Time  6    Period  Weeks    Status  Achieved      PT LONG TERM GOAL #3   Title  Pt to demo improved strength of [name] to 5/5 for all motions    Time  6    Period  Weeks    Status  Partially Met      PT LONG TERM GOAL #4   Title  Pt to demo full ROM for all [name] motions, including  IR behind back, to be WNL, to improve ability for IADLS and work duties.    Time  6    Period  Weeks    Status  Achieved            Plan - 11/21/19 1512    Clinical Impression Statement  Pt making good progress. He reports minimal/no pain in shoulder with all ROM. He does have increased pain in rib today from injury this week. Pt doing very well with HEP at this time, for strengthening of shoulder. Pt wants to wait until January to have possible MRI or futher assessment of shoulder. Wants to continue HEP until then. Pt has met goals at this time, is ready for d/c to HEP.    Examination-Activity Limitations  Reach Overhead;Lift    Examination-Participation Restrictions  Cleaning;Community Activity;Driving;Yard Work    Stability/Clinical Decision Making  Stable/Uncomplicated    Rehab Potential  Good    PT Frequency  2x / week    PT Duration  6 weeks    PT Treatment/Interventions  ADLs/Self Care Home Management;Cryotherapy;Electrical Stimulation;DME Instruction;Ultrasound;Traction;Moist Heat;Iontophoresis 81m/ml Dexamethasone;Stair training;Functional mobility training;Therapeutic activities;Therapeutic exercise;Neuromuscular re-education;Manual techniques;Patient/family education;Dry needling;Passive range of motion;Energy conservation;Taping;Vasopneumatic Device;Spinal Manipulations;Joint Manipulations    Consulted and Agree with Plan of Care  Patient       Patient will benefit from skilled therapeutic intervention in order to improve the following deficits and impairments:  Decreased range of motion, Impaired UE functional use, Increased muscle spasms, Pain, Decreased activity tolerance, Improper body mechanics, Decreased strength, Decreased mobility, Impaired flexibility  Visit Diagnosis: Acute pain of left shoulder     Problem List Patient  Active Problem List   Diagnosis Date Noted  . Alcohol consuption of more than two drinks per day   . Anxiety   . Depression   .  Obsessive-compulsive disorder 12/17/2017  . Generalized anxiety disorder 12/17/2017    Lyndee Hensen, PT, DPT 3:14 PM  11/21/19    [facility] [place], [place], [address] Phone: [phone]   Fax:  [phone]  Name: Chase Nunez MRN: [id number] Date of Birth: [birth year]   PHYSICAL THERAPY DISCHARGE SUMMARY  Visits from Start of Care:8  Plan: Patient agrees to discharge.  Patient goals were met. Patient is being discharged due to meeting the stated rehab goals.  ?????      Lyndee Hensen, PT, DPT 3:15 PM  11/21/19

## 2019-11-24 ENCOUNTER — Telehealth: Payer: Self-pay | Admitting: Physician Assistant

## 2019-11-24 ENCOUNTER — Other Ambulatory Visit: Payer: Self-pay | Admitting: Physician Assistant

## 2019-11-24 ENCOUNTER — Encounter: Payer: 59 | Admitting: Physical Therapy

## 2019-11-24 MED ORDER — FLUOXETINE HCL 40 MG PO CAPS: 80.0000 mg | ORAL_CAPSULE | Freq: Every day | ORAL | 2 refills | Status: DC

## 2019-11-24 NOTE — Telephone Encounter (Signed)
I would have sent this in, but last documentation I can find is that he was increased to 60 mg.  Wanted to confirm.  Thanks.

## 2019-11-24 NOTE — Telephone Encounter (Signed)
Please see Rx and advise dosage.

## 2019-11-24 NOTE — Telephone Encounter (Signed)
FLUoxetine (PROZAC) 40 MG capsule      Patient calling to request refill. Patient states he was switched to 80 mg by PCP.   [place], [place] Phone:  [phone]  Fax:  [phone]

## 2019-11-24 NOTE — Telephone Encounter (Signed)
Sent in

## 2019-11-25 ENCOUNTER — Encounter: Payer: Self-pay | Admitting: Physician Assistant

## 2019-11-25 MED FILL — FLUoxetine HCL 40 MG CAPS: 40 | 30 days supply | Qty: 60 | Fill #0

## 2019-12-01 ENCOUNTER — Encounter: Payer: 59 | Admitting: Physical Therapy

## 2019-12-08 ENCOUNTER — Encounter: Payer: 59 | Admitting: Physical Therapy

## 2019-12-13 ENCOUNTER — Ambulatory Visit (INDEPENDENT_AMBULATORY_CARE_PROVIDER_SITE_OTHER): Payer: No Typology Code available for payment source | Admitting: Psychology

## 2019-12-13 ENCOUNTER — Other Ambulatory Visit: Payer: Self-pay | Admitting: Physician Assistant

## 2019-12-13 ENCOUNTER — Encounter: Payer: Self-pay | Admitting: Physician Assistant

## 2019-12-13 DIAGNOSIS — F4321 Adjustment disorder with depressed mood: Secondary | ICD-10-CM | POA: Diagnosis not present

## 2019-12-13 DIAGNOSIS — F411 Generalized anxiety disorder: Secondary | ICD-10-CM

## 2019-12-13 NOTE — Telephone Encounter (Signed)
Do I need to put in a new referral for Chase Nunez?

## 2019-12-27 MED FILL — FLUoxetine HCL 40 MG CAPS: 40 | 30 days supply | Qty: 60 | Fill #1

## 2020-01-12 ENCOUNTER — Ambulatory Visit: Payer: No Typology Code available for payment source | Admitting: Psychology

## 2020-01-24 MED FILL — FLUoxetine HCL 40 MG CAPS: 40 | 30 days supply | Qty: 60 | Fill #2

## 2020-02-17 ENCOUNTER — Ambulatory Visit (INDEPENDENT_AMBULATORY_CARE_PROVIDER_SITE_OTHER): Payer: No Typology Code available for payment source | Admitting: Psychology

## 2020-02-17 DIAGNOSIS — F4321 Adjustment disorder with depressed mood: Secondary | ICD-10-CM

## 2020-02-28 ENCOUNTER — Other Ambulatory Visit: Payer: Self-pay | Admitting: Physician Assistant

## 2020-02-28 ENCOUNTER — Encounter: Payer: Self-pay | Admitting: Physician Assistant

## 2020-02-28 MED FILL — FLUoxetine HCL 40 MG CAPS: 40 | 30 days supply | Qty: 60 | Fill #0

## 2020-02-28 NOTE — Telephone Encounter (Signed)
LVM for patient to call back and schedule a virtual visit with Samantha.  °

## 2020-02-28 NOTE — Telephone Encounter (Signed)
Okay for virtual visit or in person?

## 2020-02-28 NOTE — Telephone Encounter (Signed)
Please call pt and schedule virtual visit per Samantha. °

## 2020-02-29 ENCOUNTER — Encounter: Payer: Self-pay | Admitting: Physician Assistant

## 2020-02-29 ENCOUNTER — Ambulatory Visit (INDEPENDENT_AMBULATORY_CARE_PROVIDER_SITE_OTHER): Payer: No Typology Code available for payment source | Admitting: Physician Assistant

## 2020-02-29 VITALS — Ht 66.0 in | Wt 190.0 lb

## 2020-02-29 DIAGNOSIS — M25511 Pain in right shoulder: Secondary | ICD-10-CM

## 2020-02-29 DIAGNOSIS — M25522 Pain in left elbow: Secondary | ICD-10-CM

## 2020-02-29 DIAGNOSIS — M25512 Pain in left shoulder: Secondary | ICD-10-CM

## 2020-02-29 MED ORDER — METHYLPREDNISOLONE 4 MG PO TBPK: ORAL_TABLET | ORAL | 0 refills | Status: DC

## 2020-02-29 MED FILL — METHYLPREDNISOLONE 4 MG TBP: 4 | 6 days supply | Qty: 21 | Fill #0

## 2020-02-29 NOTE — Progress Notes (Signed)
Virtual Visit via Video   I connected with Chase Nunez on 02/29/20 at  9:30 AM EDT by a video enabled telemedicine application and verified that I am speaking with the correct person using two identifiers. Location patient: Home Location provider: [facility], Office Persons participating in the virtual visit: Chase Nunez, Inda Coke PA-C, Chase Pickler, LPN   I discussed the limitations of evaluation and management by telemedicine and the availability of in person appointments. The patient expressed understanding and agreed to proceed.  I acted as a Education administrator for [employer], PA-C [employer], LPN  Subjective:   HPI:   Shoulder and Elbow pain Pt c/o pain both shoulders and left elbow, thinks he has some inflammation. Has history of rotator cuff injury in his [name]. He denies weakness, loss of function, numbness or tingling. Denies known trauma but has been exercising more and preparing for a mma-like competition this weekend. He is interested in rx for prednisone, he can tolerate this well. Endorses normal ROM.   ROS: See pertinent positives and negatives per HPI.  Patient Active Problem List   Diagnosis Date Noted  . Alcohol consuption of more than two drinks per day   . Anxiety   . Depression   . Obsessive-compulsive disorder 12/17/2017  . Generalized anxiety disorder 12/17/2017    Social History   Tobacco Use  . Smoking status: Never Smoker  . Smokeless tobacco: Current User    Types: Snuff  Substance Use Topics  . Alcohol use: Yes    Alcohol/week: 10.0 standard drinks    Types: 10 Shots of liquor per week    Current Outpatient Medications:  .  diclofenac sodium (VOLTAREN) 1 % GEL, Apply 4 g topically 4 (four) times daily. To affected joint., Disp: 100 g, Rfl: 11 .  FLUoxetine (PROZAC) 40 MG capsule, TAKE 2 CAPSULES BY MOUTH DAILY, Disp: 60 capsule, Rfl: 2 .  ibuprofen (ADVIL,MOTRIN) 200 MG tablet, Take 600-800 mg by mouth as needed., Disp: , Rfl:    .  Magnesium 500 MG CAPS, Take 1 capsule by mouth daily., Disp: , Rfl:  .  Multiple Vitamins-Minerals (MULTIVITAMIN WITH MINERALS) tablet, Take 1 tablet by mouth daily., Disp: , Rfl:  .  Omega-3 Fatty Acids (FISH OIL) 1000 MG CAPS, Take by mouth., Disp: , Rfl:  .  methylPREDNISolone (MEDROL DOSEPAK) 4 MG TBPK tablet, 6-5-4-3-2-1-off, Disp: 21 each, Rfl: 0  No Known Allergies  Objective:   VITALS: Per patient if applicable, see vitals. GENERAL: Alert, appears well and in no acute distress. HEENT: Atraumatic, conjunctiva clear, no obvious abnormalities on inspection of external nose and ears. NECK: Normal movements of the head and neck. CARDIOPULMONARY: No increased WOB. Speaking in clear sentences. I:E ratio WNL.  MS: Moves all visible extremities without noticeable abnormality. PSYCH: Pleasant and cooperative, well-groomed. Speech normal rate and rhythm. Affect is appropriate. Insight and judgement are appropriate. Attention is focused, linear, and appropriate.  NEURO: CN grossly intact. Oriented as arrived to appointment on time with no prompting. Moves both UE equally.  SKIN: No obvious lesions, wounds, erythema, or cyanosis noted on face or hands.  Assessment and Plan:   Chase Nunez was seen today for shoulder pain and elbow pain.  Diagnoses and all orders for this visit:  Acute pain of both shoulders; Left elbow pain Will trial oral prednisone taper for inflammation. Discussed risks of medication including: insomnia, weight gain, irritability. Encouraged patient to avoid any aggressive activity while on this medication to prevent further injury. Low  threshold to see sports medicine (has seen Dr. [name] in the past) if any injury arises or has lack of resolution of symptoms.  Other orders -     methylPREDNISolone (MEDROL DOSEPAK) 4 MG TBPK tablet; 6-5-4-3-2-1-off  . Reviewed expectations re: course of current medical issues. . Discussed self-management of symptoms. . Outlined signs and  symptoms indicating need for more acute intervention. . Patient verbalized understanding and all questions were answered. Marland Kitchen Health Maintenance issues including appropriate healthy diet, exercise, and smoking avoidance were discussed with patient. . See orders for this visit as documented in the electronic medical record.  I discussed the assessment and treatment plan with the patient. The patient was provided an opportunity to ask questions and all were answered. The patient agreed with the plan and demonstrated an understanding of the instructions.   The patient was advised to call back or seek an in-person evaluation if the symptoms worsen or if the condition fails to improve as anticipated.   CMA or LPN served as scribe during this visit. History, Physical, and Plan performed by medical provider. The above documentation has been reviewed and is accurate and complete.   [place], [place] 02/29/2020

## 2020-03-27 MED FILL — FLUoxetine HCL 40 MG CAPS: 40 | 30 days supply | Qty: 60 | Fill #1

## 2020-04-05 ENCOUNTER — Ambulatory Visit: Payer: No Typology Code available for payment source | Admitting: Psychology

## 2020-04-07 ENCOUNTER — Ambulatory Visit (INDEPENDENT_AMBULATORY_CARE_PROVIDER_SITE_OTHER): Payer: No Typology Code available for payment source | Admitting: Psychology

## 2020-04-07 DIAGNOSIS — F4321 Adjustment disorder with depressed mood: Secondary | ICD-10-CM

## 2020-04-28 MED FILL — FLUoxetine HCL 40 MG CAPS: 40 | 30 days supply | Qty: 60 | Fill #2

## 2020-05-12 ENCOUNTER — Ambulatory Visit (INDEPENDENT_AMBULATORY_CARE_PROVIDER_SITE_OTHER): Payer: No Typology Code available for payment source | Admitting: Psychology

## 2020-05-12 DIAGNOSIS — F4321 Adjustment disorder with depressed mood: Secondary | ICD-10-CM | POA: Diagnosis not present

## 2020-05-22 ENCOUNTER — Other Ambulatory Visit: Payer: Self-pay

## 2020-05-22 ENCOUNTER — Encounter: Payer: Self-pay | Admitting: Physician Assistant

## 2020-05-22 ENCOUNTER — Telehealth (INDEPENDENT_AMBULATORY_CARE_PROVIDER_SITE_OTHER): Payer: No Typology Code available for payment source | Admitting: Physician Assistant

## 2020-05-22 VITALS — Temp 98.0°F | Ht 66.0 in | Wt 190.0 lb

## 2020-05-22 DIAGNOSIS — R0981 Nasal congestion: Secondary | ICD-10-CM

## 2020-05-22 MED ORDER — DOXYCYCLINE HYCLATE 100 MG PO TABS: 100.0000 mg | ORAL_TABLET | Freq: Two times a day (BID) | ORAL | 0 refills | Status: DC

## 2020-05-22 MED ORDER — PREDNISONE 20 MG PO TABS: 40.0000 mg | ORAL_TABLET | Freq: Every day | ORAL | 0 refills | Status: DC

## 2020-05-22 MED FILL — DOXYCYCLINE HYCLATE 100 MG: 100 | 10 days supply | Qty: 20 | Fill #0

## 2020-05-22 MED FILL — predniSONE 20 MG TABS: 20 | 5 days supply | Qty: 10 | Fill #0

## 2020-05-22 NOTE — Progress Notes (Signed)
Virtual Visit via Video   I connected with Chase Nunez on 05/22/20 at  4:00 PM EDT by a video enabled telemedicine application and verified that I am speaking with the correct person using two identifiers. Location patient: Home Location provider: [facility], Office Persons participating in the virtual visit: Telly Jawad, Jarold Motto PA-C, Corky Mull, LPN   I discussed the limitations of evaluation and management by telemedicine and the availability of in person appointments. The patient expressed understanding and agreed to proceed.  I acted as a Neurosurgeon for [employer], PA-C [employer], LPN   Subjective:   HPI:   Cough Pt c/o cough expectorating yellow sputum and nasal congestion with yellow drainage. Started last Thurs had a sore throat but that is better. He has had some chills but no fever. Pt is taking Coricidin HBP cough syrup and Motrin. Denies: chest pain, SOB, nausea. Recently traveled to the beach and symptoms started while he was there.  Received J&J vaccine in May.  ROS: See pertinent positives and negatives per HPI.  Patient Active Problem List   Diagnosis Date Noted  . Alcohol consuption of more than two drinks per day   . Anxiety   . Depression   . Obsessive-compulsive disorder 12/17/2017  . Generalized anxiety disorder 12/17/2017    Social History   Tobacco Use  . Smoking status: Never Smoker  . Smokeless tobacco: Current User    Types: Snuff  Substance Use Topics  . Alcohol use: Yes    Alcohol/week: 10.0 standard drinks    Types: 10 Shots of liquor per week    Current Outpatient Medications:  .  diclofenac sodium (VOLTAREN) 1 % GEL, Apply 4 g topically 4 (four) times daily. To affected joint., Disp: 100 g, Rfl: 11 .  FLUoxetine (PROZAC) 40 MG capsule, TAKE 2 CAPSULES BY MOUTH DAILY, Disp: 60 capsule, Rfl: 2 .  ibuprofen (ADVIL,MOTRIN) 200 MG tablet, Take 600-800 mg by mouth as needed., Disp: , Rfl:  .  Magnesium 500 MG CAPS, Take  1 capsule by mouth daily., Disp: , Rfl:  .  Multiple Vitamins-Minerals (MULTIVITAMIN WITH MINERALS) tablet, Take 1 tablet by mouth daily., Disp: , Rfl:  .  Omega-3 Fatty Acids (FISH OIL) 1000 MG CAPS, Take by mouth., Disp: , Rfl:  .  doxycycline (VIBRA-TABS) 100 MG tablet, Take 1 tablet (100 mg total) by mouth 2 (two) times daily., Disp: 20 tablet, Rfl: 0 .  predniSONE (DELTASONE) 20 MG tablet, Take 2 tablets (40 mg total) by mouth daily., Disp: 10 tablet, Rfl: 0  No Known Allergies  Objective:   VITALS: Per patient if applicable, see vitals. GENERAL: Alert, appears well and in no acute distress. HEENT: Atraumatic, conjunctiva clear, no obvious abnormalities on inspection of external nose and ears. NECK: Normal movements of the head and neck. CARDIOPULMONARY: No increased WOB. Speaking in clear sentences. I:E ratio WNL.  MS: Moves all visible extremities without noticeable abnormality. PSYCH: Pleasant and cooperative, well-groomed. Speech normal rate and rhythm. Affect is appropriate. Insight and judgement are appropriate. Attention is focused, linear, and appropriate.  NEURO: CN grossly intact. Oriented as arrived to appointment on time with no prompting. Moves both UE equally.  SKIN: No obvious lesions, wounds, erythema, or cyanosis noted on face or hands.  Assessment and Plan:   Chase Nunez was seen today for cough and nasal congestion.  Diagnoses and all orders for this visit:  Sinus congestion  Other orders -     predniSONE (DELTASONE) 20 MG tablet;  Take 2 tablets (40 mg total) by mouth daily. -     doxycycline (VIBRA-TABS) 100 MG tablet; Take 1 tablet (100 mg total) by mouth 2 (two) times daily.   No red flags on exam.  Possible early sinusitis. Will initiate oral prednisone per orders. Did give pocket rx for doxycycline should his symptoms worsen or not improve. Discussed taking medications as prescribed. Reviewed return precautions including worsening fever, SOB, worsening cough or  other concerns. Push fluids and rest. I recommend that patient follow-up if symptoms worsen or persist despite treatment x 7-10 days, sooner if needed.   . Reviewed expectations re: course of current medical issues. . Discussed self-management of symptoms. . Outlined signs and symptoms indicating need for more acute intervention. . Patient verbalized understanding and all questions were answered. Marland Kitchen Health Maintenance issues including appropriate healthy diet, exercise, and smoking avoidance were discussed with patient. . See orders for this visit as documented in the electronic medical record.  I discussed the assessment and treatment plan with the patient. The patient was provided an opportunity to ask questions and all were answered. The patient agreed with the plan and demonstrated an understanding of the instructions.   The patient was advised to call back or seek an in-person evaluation if the symptoms worsen or if the condition fails to improve as anticipated.   CMA or LPN served as scribe during this visit. History, Physical, and Plan performed by medical provider. The above documentation has been reviewed and is accurate and complete.   [place], [place] 05/22/2020

## 2020-05-29 ENCOUNTER — Other Ambulatory Visit: Payer: Self-pay | Admitting: Physician Assistant

## 2020-05-29 MED FILL — FLUoxetine HCL 40 MG CAPS: 40 | 30 days supply | Qty: 60 | Fill #0

## 2020-06-07 ENCOUNTER — Ambulatory Visit (INDEPENDENT_AMBULATORY_CARE_PROVIDER_SITE_OTHER): Payer: No Typology Code available for payment source | Admitting: Psychology

## 2020-06-07 DIAGNOSIS — F4321 Adjustment disorder with depressed mood: Secondary | ICD-10-CM | POA: Diagnosis not present

## 2020-06-30 ENCOUNTER — Other Ambulatory Visit: Payer: Self-pay | Admitting: Physician Assistant

## 2020-07-03 ENCOUNTER — Encounter: Payer: Self-pay | Admitting: Physician Assistant

## 2020-07-03 ENCOUNTER — Telehealth (INDEPENDENT_AMBULATORY_CARE_PROVIDER_SITE_OTHER): Payer: No Typology Code available for payment source | Admitting: Physician Assistant

## 2020-07-03 ENCOUNTER — Other Ambulatory Visit: Payer: Self-pay | Admitting: Physician Assistant

## 2020-07-03 ENCOUNTER — Other Ambulatory Visit: Payer: Self-pay

## 2020-07-03 VITALS — Ht 66.0 in | Wt 190.0 lb

## 2020-07-03 DIAGNOSIS — F321 Major depressive disorder, single episode, moderate: Secondary | ICD-10-CM

## 2020-07-03 DIAGNOSIS — F411 Generalized anxiety disorder: Secondary | ICD-10-CM

## 2020-07-03 MED ORDER — FLUOXETINE HCL 40 MG PO CAPS: 80.0000 mg | ORAL_CAPSULE | Freq: Every day | ORAL | 3 refills | Status: DC

## 2020-07-03 MED FILL — FLUoxetine HCL 40 MG CAPS: 40 | 90 days supply | Qty: 180 | Fill #0

## 2020-07-03 NOTE — Progress Notes (Signed)
Virtual Visit via Video   I connected with Chase Nunez on 07/03/20 at  4:00 PM EDT by a video enabled telemedicine application and verified that I am speaking with the correct person using two identifiers. Location patient: Home Location provider: [facility], Office Persons participating in the virtual visit: Jedediah Noda, Jarold Motto PA-C, Corky Mull, LPN   I discussed the limitations of evaluation and management by telemedicine and the availability of in person appointments. The patient expressed understanding and agreed to proceed.  I acted as a Neurosurgeon for [employer], [employer], LPN   Subjective:   HPI:   Anxiety/Depression Pt following up today, needs refill on Prozac 80 mg daily. Tolerating well. Denies SI/HI. Feels well controlled on this medication. Denies worsening mood.   Depression screen [facility] 2/9 07/03/2020 10/11/2019 09/08/2019 03/05/2019 08/18/2018  Decreased Interest 0 2 3 1  0  Down, Depressed, Hopeless 0 1 3 1  0  PHQ - 2 Score 0 3 6 2  0  Altered sleeping 1 1 3 1  -  Tired, decreased energy 1 0 3 1 -  Change in appetite 1 0 3 2 -  Feeling bad or failure about yourself  0 2 3 2  -  Trouble concentrating 1 0 3 2 -  Moving slowly or fidgety/restless 0 1 2 2  -  Suicidal thoughts 0 0 1 0 -  PHQ-9 Score 4 7 24 12  -  Difficult doing work/chores Not difficult at all Somewhat difficult Extremely dIfficult Not difficult at all -     ROS: See pertinent positives and negatives per HPI.  Patient Active Problem List   Diagnosis Date Noted  . Alcohol consuption of more than two drinks per day   . Anxiety   . Depression   . Obsessive-compulsive disorder 12/17/2017  . Generalized anxiety disorder 12/17/2017    Social History   Tobacco Use  . Smoking status: Never Smoker  . Smokeless tobacco: Current User    Types: Snuff  Substance Use Topics  . Alcohol use: Yes    Alcohol/week: 10.0 standard drinks    Types: 10 Shots of liquor per week     Current Outpatient Medications:  .  diclofenac sodium (VOLTAREN) 1 % GEL, Apply 4 g topically 4 (four) times daily. To affected joint., Disp: 100 g, Rfl: 11 .  FLUoxetine (PROZAC) 40 MG capsule, Take 2 capsules (80 mg total) by mouth daily., Disp: 180 capsule, Rfl: 3 .  ibuprofen (ADVIL,MOTRIN) 200 MG tablet, Take 600-800 mg by mouth as needed., Disp: , Rfl:  .  Magnesium 500 MG CAPS, Take 1 capsule by mouth daily., Disp: , Rfl:  .  Multiple Vitamins-Minerals (MULTIVITAMIN WITH MINERALS) tablet, Take 1 tablet by mouth daily., Disp: , Rfl:  .  Omega-3 Fatty Acids (FISH OIL) 1000 MG CAPS, Take by mouth., Disp: , Rfl:   No Known Allergies  Objective:   VITALS: Per patient if applicable, see vitals. GENERAL: Alert, appears well and in no acute distress. HEENT: Atraumatic, conjunctiva clear, no obvious abnormalities on inspection of external nose and ears. NECK: Normal movements of the head and neck. CARDIOPULMONARY: No increased WOB. Speaking in clear sentences. I:E ratio WNL.  MS: Moves all visible extremities without noticeable abnormality. PSYCH: Pleasant and cooperative, well-groomed. Speech normal rate and rhythm. Affect is appropriate. Insight and judgement are appropriate. Attention is focused, linear, and appropriate.  NEURO: CN grossly intact. Oriented as arrived to appointment on time with no prompting. Moves both UE equally.  SKIN: No  obvious lesions, wounds, erythema, or cyanosis noted on face or hands.  Assessment and Plan:   Chase Nunez was seen today for anxiety and depression.  Diagnoses and all orders for this visit:  Depression, major, single episode, moderate (HCC)  Generalized anxiety disorder  Other orders -     FLUoxetine (PROZAC) 40 MG capsule; Take 2 capsules (80 mg total) by mouth daily.   Tolerating medication well. Continue Prozac 80 mg daily. Follow-up in 6 months, sooner if concerns.  . Reviewed expectations re: course of current medical  issues. . Discussed self-management of symptoms. . Outlined signs and symptoms indicating need for more acute intervention. . Patient verbalized understanding and all questions were answered. Marland Kitchen Health Maintenance issues including appropriate healthy diet, exercise, and smoking avoidance were discussed with patient. . See orders for this visit as documented in the electronic medical record.  I discussed the assessment and treatment plan with the patient. The patient was provided an opportunity to ask questions and all were answered. The patient agreed with the plan and demonstrated an understanding of the instructions.   The patient was advised to call back or seek an in-person evaluation if the symptoms worsen or if the condition fails to improve as anticipated.   CMA or LPN served as scribe during this visit. History, Physical, and Plan performed by medical provider. The above documentation has been reviewed and is accurate and complete.   [place], [place] 07/03/2020

## 2020-07-17 ENCOUNTER — Ambulatory Visit (INDEPENDENT_AMBULATORY_CARE_PROVIDER_SITE_OTHER): Payer: No Typology Code available for payment source | Admitting: Physician Assistant

## 2020-07-17 ENCOUNTER — Encounter: Payer: Self-pay | Admitting: Physician Assistant

## 2020-07-17 ENCOUNTER — Other Ambulatory Visit: Payer: Self-pay

## 2020-07-17 VITALS — BP 128/80 | HR 64 | Temp 98.4°F | Resp 18 | Ht 66.0 in | Wt 200.6 lb

## 2020-07-17 DIAGNOSIS — Z13220 Encounter for screening for lipoid disorders: Secondary | ICD-10-CM | POA: Diagnosis not present

## 2020-07-17 DIAGNOSIS — R1013 Epigastric pain: Secondary | ICD-10-CM | POA: Diagnosis not present

## 2020-07-17 DIAGNOSIS — M545 Low back pain, unspecified: Secondary | ICD-10-CM

## 2020-07-17 DIAGNOSIS — Z8249 Family history of ischemic heart disease and other diseases of the circulatory system: Secondary | ICD-10-CM

## 2020-07-17 DIAGNOSIS — R002 Palpitations: Secondary | ICD-10-CM

## 2020-07-17 DIAGNOSIS — Z136 Encounter for screening for cardiovascular disorders: Secondary | ICD-10-CM

## 2020-07-17 DIAGNOSIS — Z789 Other specified health status: Secondary | ICD-10-CM

## 2020-07-17 MED ORDER — PANTOPRAZOLE SODIUM 40 MG PO TBEC: 40.0000 mg | DELAYED_RELEASE_TABLET | Freq: Every day | ORAL | 3 refills | Status: DC

## 2020-07-17 MED ORDER — DICLOFENAC SODIUM 75 MG PO TBEC: 75.0000 mg | DELAYED_RELEASE_TABLET | Freq: Two times a day (BID) | ORAL | 0 refills | Status: DC

## 2020-07-17 MED FILL — DICLOFENAC SODIUM 75 MG TAB: 75 | 15 days supply | Qty: 30 | Fill #0

## 2020-07-17 MED FILL — PANTOPRAZOLE SOD DR 40 MG T: 40 | 30 days supply | Qty: 30 | Fill #0

## 2020-07-17 NOTE — Patient Instructions (Signed)
It was great to see you!  For your stomach: please start oral protonix and work on reduction of alcohol  I would like Dr. [name] (sports medicine) to weigh in your back, so I will put in a referral for you to see him for this. You may trial oral diclofenac for your symptoms.   We will refer you to cardiology.  If you develop chest pain or severe changes to pain or shortness of breath, please go to the ER.  I will be in touch with your lab results.   Take care,  Jarold Motto PA-C

## 2020-07-17 NOTE — Progress Notes (Signed)
Chase Nunez is a 44 y.o. male here for a new problem.   History of Present Illness:   Chief Complaint  Patient presents with  . Back and Abdominal pain    x2-3 weeks. Pain starts in his lower back that radiates to his abdomen. Denies any injury.    HPI   Low back pain For the past few weeks, when he stands up to do something he has stiff low back. As the day goes on, pain gradually improves. 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 Denies hematuria, numbness and tingling, saddle anesthesia, bowel incontinence. Will take ibuprofen prn for this.  Palpitations/Family history of heart disease Has had 2-3 episodes of "feeling like my blood is going to boil" and feelings of fast heartrate. Typically lasts just a few seconds until he can get in front of a fan or move to a cooler area. Symptoms typically happen when he is working out or in a really hot environment. Denies: chest pain, SOB, LE swelling with this. States that his father required quadruple bypass and he is anxious about his health because of this.  He does not recent changes in lifestyle: has gained about 10-15 lb. Eating more fast food due to hectic schedule. Drinking about 4 [name] & Coke's daily. Drinks about 30 oz coffee daily. Started a work-out supplement that he brought with him today that contains Taurine and Green Coffee Extract. Has had some episodes of heartburn as well. Will take occasional tums.   Past Medical History:  Diagnosis Date  . Alcohol consuption of more than two drinks per day   . Anxiety   . Arthritis   . Depression   . H/O drug abuse (HCC)    none per patient since age 38  . History of chicken pox      Social History   Tobacco Use  . Smoking status: Never Smoker  . Smokeless tobacco: Current User    Types: Snuff  Vaping Use  . Vaping Use: Never used  Substance Use Topics  . Alcohol use: Yes    Alcohol/week: 10.0 standard drinks    Types: 10 Shots of  liquor per week  . Drug use: No    Past Surgical History:  Procedure Laterality Date  . SHOULDER ARTHROSCOPY      Family History  Problem Relation Age of Onset  . Thyroid disease Mother   . Birth defects Mother   . Miscarriages / [place] Mother   . Heart disease Father   . Hypertension Father   . Alcohol abuse Father   . Arthritis Father   . Drug abuse Father   . High Cholesterol Father   . Heart disease Maternal Grandfather   . Hyperlipidemia Maternal Grandfather   . COPD Maternal Grandfather   . Diabetes Paternal Grandmother   . Heart disease Paternal Grandmother   . Kidney disease Paternal Grandmother   . Heart disease Paternal Grandfather   . Diabetes Paternal Grandfather   . Hyperlipidemia Paternal Grandfather   . COPD Maternal Grandmother   . Colon cancer Neg Hx   . Prostate cancer Neg Hx     No Known Allergies  Current Medications:   Current Outpatient Medications:  .  diclofenac sodium (VOLTAREN) 1 % GEL, Apply 4 g topically 4 (four) times daily. To affected joint. (Patient taking differently: Apply 4 g topically as needed. To affected joint.), Disp: 100 g, Rfl: 11 .  FLUoxetine (PROZAC) 40 MG  capsule, Take 2 capsules (80 mg total) by mouth daily., Disp: 180 capsule, Rfl: 3 .  ibuprofen (ADVIL,MOTRIN) 200 MG tablet, Take 600-800 mg by mouth as needed., Disp: , Rfl:  .  Multiple Vitamins-Minerals (MULTIVITAMIN WITH MINERALS) tablet, Take 1 tablet by mouth daily., Disp: , Rfl:  .  Omega-3 Fatty Acids (FISH OIL) 1000 MG CAPS, Take by mouth., Disp: , Rfl:  .  UNABLE TO FIND, Take 3 capsules by mouth at bedtime. Med Name: ZMA Tech (Vitamin B6, Magnesium and Zinc), Disp: , Rfl:  .  diclofenac (VOLTAREN) 75 MG EC tablet, Take 1 tablet (75 mg total) by mouth 2 (two) times daily., Disp: 30 tablet, Rfl: 0 .  pantoprazole (PROTONIX) 40 MG tablet, Take 1 tablet (40 mg total) by mouth daily., Disp: 30 tablet, Rfl: 3   Review of Systems:   ROS  Negative unless  otherwise specified per HPI.  Vitals:   Vitals:   07/17/20 1349  BP: 128/80  Pulse: 64  Resp: 18  Temp: 98.4 F (36.9 C)  TempSrc: Temporal  SpO2: 96%  Weight: 200 lb 9.6 oz (91 kg)  Height: 5\' 6"  (1.676 m)     Body mass index is 32.38 kg/m.  Physical Exam:   Physical Exam Vitals and nursing note reviewed.  Constitutional:      General: He is not in acute distress.    Appearance: He is well-developed. He is not ill-appearing or toxic-appearing.  Cardiovascular:     Rate and Rhythm: Normal rate and regular rhythm.     Pulses: Normal pulses.     Heart sounds: Normal heart sounds, S1 normal and S2 normal.     Comments: No LE edema Pulmonary:     Effort: Pulmonary effort is normal.     Breath sounds: Normal breath sounds.  Abdominal:     General: Abdomen is flat. Bowel sounds are normal.     Palpations: Abdomen is soft.     Tenderness: There is no abdominal tenderness.  Musculoskeletal:     Comments: No decreased ROM 2/2 pain with flexion/extension, lateral side bends, or rotation. Reproducible tenderness with deep palpation to bilateral paraspinal lumbar muscles. No bony tenderness.   Skin:    General: Skin is warm and dry.  Neurological:     Mental Status: He is alert.     GCS: GCS eye subscore is 4. GCS verbal subscore is 5. GCS motor subscore is 6.  Psychiatric:        Speech: Speech normal.        Behavior: Behavior normal. Behavior is cooperative.       Assessment and Plan:   Chase Nunez was seen today for back and abdominal pain.  Diagnoses and all orders for this visit:  Epigastric pain Suspect possible gastritis given lifestyle (diet, alcohol) vs pancreatitis vs ulcer vs GERD vs other. Start oral protonix. Update labs. No acute abdomen on exam.  Work on alcohol reduction and cleaning up diet. Follow-up if symptoms worsen/persist. -     TSH; Future -     Comprehensive metabolic panel; Future -     CBC with Differential/Platelet; Future -     Lipase;  Future -     H. pylori antibody, IgG; Future -     EKG 12-Lead -     Lipase -     CBC with Differential/Platelet -     Comprehensive metabolic panel -     TSH -     H. pylori antibody, IgG  Alcohol  consuption of more than two drinks per day Counseled. -     Lipase; Future -     H. pylori antibody, IgG; Future -     Lipase -     H. pylori antibody, IgG  Encounter for lipid screening for cardiovascular disease -     Lipid panel; Future -     Lipid panel  Acute bilateral low back pain without sciatica No red flags on exam. Possible muscle strain? Will update labs. Oral diclofenac prn. Follow-up with Dr. Lillia Carmel. -     Ambulatory referral to Sports Medicine  Family history of cardiac disorder; Heart palpitations EKG tracing is personally reviewed.  EKG notes NSR.  No acute changes. Referral to cardiology. Patient requesting to see Dr. Flora Lipps (wife sees this cardiologist.) Worsening precautions advised in the interim. -     Ambulatory referral to Cardiology  Other orders -     pantoprazole (PROTONIX) 40 MG tablet; Take 1 tablet (40 mg total) by mouth daily. -     diclofenac (VOLTAREN) 75 MG EC tablet; Take 1 tablet (75 mg total) by mouth 2 (two) times daily.    . Reviewed expectations re: course of current medical issues. . Discussed self-management of symptoms. . Outlined signs and symptoms indicating need for more acute intervention. . Patient verbalized understanding and all questions were answered. . See orders for this visit as documented in the electronic medical record. . Patient received an After-Visit Summary.  CMA or LPN served as scribe during this visit. History, Physical, and Plan performed by medical provider. The above documentation has been reviewed and is accurate and complete.   Jarold Motto, PA-C

## 2020-07-18 LAB — COMPREHENSIVE METABOLIC PANEL
AG Ratio: 1.8 (calc) (ref 1.0–2.5)
ALT: 20 U/L (ref 9–46)
AST: 31 U/L (ref 10–40)
Albumin: 4.6 g/dL (ref 3.6–5.1)
Alkaline phosphatase (APISO): 73 U/L (ref 36–130)
BUN: 14 mg/dL (ref 7–25)
CO2: 27 mmol/L (ref 20–32)
Calcium: 9.4 mg/dL (ref 8.6–10.3)
Chloride: 101 mmol/L (ref 98–110)
Creat: 0.93 mg/dL (ref 0.60–1.35)
Globulin: 2.5 g/dL (calc) (ref 1.9–3.7)
Glucose, Bld: 92 mg/dL (ref 65–99)
Potassium: 4.1 mmol/L (ref 3.5–5.3)
Sodium: 136 mmol/L (ref 135–146)
Total Bilirubin: 0.7 mg/dL (ref 0.2–1.2)
Total Protein: 7.1 g/dL (ref 6.1–8.1)

## 2020-07-18 LAB — CBC WITH DIFFERENTIAL/PLATELET
Absolute Monocytes: 570 cells/uL (ref 200–950)
Basophils Absolute: 31 cells/uL (ref 0–200)
Basophils Relative: 0.5 %
Eosinophils Absolute: 19 cells/uL (ref 15–500)
Eosinophils Relative: 0.3 %
HCT: 43.8 % (ref 38.5–50.0)
Hemoglobin: 15.3 g/dL (ref 13.2–17.1)
Lymphs Abs: 1364 cells/uL (ref 850–3900)
MCH: 31.2 pg (ref 27.0–33.0)
MCHC: 34.9 g/dL (ref 32.0–36.0)
MCV: 89.2 fL (ref 80.0–100.0)
MPV: 10.2 fL (ref 7.5–12.5)
Monocytes Relative: 9.2 %
Neutro Abs: 4216 cells/uL (ref 1500–7800)
Neutrophils Relative %: 68 %
Platelets: 246 10*3/uL (ref 140–400)
RBC: 4.91 10*6/uL (ref 4.20–5.80)
RDW: 12.8 % (ref 11.0–15.0)
Total Lymphocyte: 22 %
WBC: 6.2 10*3/uL (ref 3.8–10.8)

## 2020-07-18 LAB — LIPID PANEL
Cholesterol: 202 mg/dL — ABNORMAL HIGH (ref <200)
HDL: 75 mg/dL (ref >=40)
LDL Cholesterol (Calc): 110 mg/dL (calc) — ABNORMAL HIGH
Non-HDL Cholesterol (Calc): 127 mg/dL (calc) (ref <130)
Total CHOL/HDL Ratio: 2.7 (calc) (ref <5.0)
Triglycerides: 77 mg/dL (ref <150)

## 2020-07-18 LAB — LIPASE: Lipase: 21 U/L (ref 7–60)

## 2020-07-18 LAB — TSH: TSH: 3.57 mIU/L (ref 0.40–4.50)

## 2020-07-18 LAB — H. PYLORI ANTIBODY, IGG: H Pylori IgG: NEGATIVE

## 2020-07-21 ENCOUNTER — Encounter: Payer: Self-pay | Admitting: Family Medicine

## 2020-07-21 ENCOUNTER — Other Ambulatory Visit: Payer: Self-pay

## 2020-07-21 ENCOUNTER — Ambulatory Visit: Payer: No Typology Code available for payment source | Admitting: Family Medicine

## 2020-07-21 VITALS — BP 120/84 | HR 73 | Ht 66.0 in | Wt 206.4 lb

## 2020-07-21 DIAGNOSIS — S39012A Strain of muscle, fascia and tendon of lower back, initial encounter: Secondary | ICD-10-CM

## 2020-07-21 NOTE — Patient Instructions (Signed)
Thank you for coming in today. Plan for PT.  If not better can do MRI  Let me know.  Use heating pad and TENS unit.   TENS UNIT: This is helpful for muscle pain and spasm.   Search and Purchase a TENS 7000 2nd edition at  www.tenspros.com or www.Amazon.com It should be less than $30.     TENS unit instructions: Do not shower or bathe with the unit on Turn the unit off before removing electrodes or batteries If the electrodes lose stickiness add a drop of water to the electrodes after they are disconnected from the unit and place on plastic sheet. If you continued to have difficulty, call the TENS unit company to purchase more electrodes. Do not apply lotion on the skin area prior to use. Make sure the skin is clean and dry as this will help prolong the life of the electrodes. After use, always check skin for unusual red areas, rash or other skin difficulties. If there are any skin problems, does not apply electrodes to the same area. Never remove the electrodes from the unit by pulling the wires. Do not use the TENS unit or electrodes other than as directed. Do not change electrode placement without consultating your therapist or physician. Keep 2 fingers with between each electrode. Wear time ratio is 2:1, on to off times.    For example on for 30 minutes off for 15 minutes and then on for 30 minutes off for 15 minutes     Lumbosacral Strain Lumbosacral strain is an injury that causes pain in the lower back (lumbosacral spine). This injury usually happens from overstretching the muscles or ligaments along your spine. Ligaments are cord-like tissues that connect bones to other bones. A strain can affect one or more muscles or ligaments. What are the causes? This condition may be caused by:  A hard, direct hit to the back.  Overstretching the lower back muscles. This may result from: ? A fall. ? Lifting something heavy. ? Repetitive movements such as bending or  crouching. What increases the risk? The following factors may make you more likely to develop this condition:  Participating in sports or activities that involve: ? A sudden twist of the back. ? Pushing or pulling motions.  Being overweight or obese.  Having poor strength and flexibility, especially tight hamstrings or weak muscles in the back or abdomen.  Having too much of a curve in the lower back.  Having a pelvis that is tilted forward. What are the signs or symptoms? The main symptom of this condition is pain in the lower back, at the site of the strain. Pain may also be felt down one or both legs. How is this diagnosed? This condition is diagnosed based on your symptoms, your medical history, and a physical exam. During the physical exam, your health care provider may push on certain areas of your back to find the source of your pain. You may be asked to bend forward, backward, and side to side to check your pain and range of motion. You may also have imaging tests, such as X-rays and an MRI. How is this treated? This condition may be treated by:  Applying heat and cold on the affected area.  Taking medicines to help relieve pain and relax your muscles.  Taking NSAIDs, such as ibuprofen, to help reduce swelling and discomfort.  Doing stretching and strengthening exercises for your lower back. Symptoms usually improve within several weeks of treatment. However, recovery time  varies. When your symptoms improve, gradually return to your normal routine as soon as possible to reduce pain, avoid stiffness, and keep muscle strength. Follow these instructions at home: Medicines  Take over-the-counter and prescription medicines only as told by your health care provider.  Ask your health care provider if the medicine prescribed to you: ? Requires you to avoid driving or using heavy machinery. ? Can cause constipation. You may need to take these actions to prevent or treat  constipation:  Drink enough fluid to keep your urine pale yellow.  Take over-the-counter or prescription medicines.  Eat foods that are high in fiber, such as beans, whole grains, and fresh fruits and vegetables.  Limit foods that are high in fat and processed sugars, such as fried or sweet foods. Managing pain, stiffness, and swelling      If directed, put ice on the injured area. To do this: ? Put ice in a plastic bag. ? Place a towel between your skin and the bag. ? Leave the ice on for 20 minutes, 2-3 times a day.  If directed, apply heat on the affected area as often as told by your health care provider. Use the heat source that your health care provider recommends, such as a moist heat pack or a heating pad. ? Place a towel between your skin and the heat source. ? Leave the heat on for 20-30 minutes. ? Remove the heat if your skin turns bright red. This is especially important if you are unable to feel pain, heat, or cold. You may have a greater risk of getting burned. Activity  Rest as told by your health care provider.  Do not stay in bed. Staying in bed for more than 1-2 days can delay your recovery.  Return to your normal activities as told by your health care provider. Ask your health care provider what activities are safe for you.  Avoid activities that take a lot of energy for as long as told by your health care provider.  Do exercises as told by your health care provider. This includes stretching and strengthening exercises. General instructions  Sit up and stand up straight. Avoid leaning forward when you sit, or hunching over when you stand.  Do not use any products that contain nicotine or tobacco, such as cigarettes, e-cigarettes, and chewing tobacco. If you need help quitting, ask your health care provider.  Keep all follow-up visits as told by your health care provider. This is important. How is this prevented?   Use correct form when playing sports and  lifting heavy objects.  Use good posture when sitting and standing.  Maintain a healthy weight.  Sleep on a mattress with medium firmness to support your back.  Do at least 150 minutes of moderate-intensity exercise each week, such as brisk walking or water aerobics. Try a form of exercise that takes stress off your back, such as swimming or stationary cycling.  Maintain physical fitness, including: ? Strength. ? Flexibility. Contact a health care provider if:  Your back pain does not improve after several weeks of treatment.  Your symptoms get worse. Get help right away if:  Your back pain is severe.  You cannot stand or walk.  You have difficulty controlling when you urinate or when you have a bowel movement.  You feel nauseous or you vomit.  Your feet or legs get very cold, turn pale, or look blue.  You have numbness, tingling, weakness, or problems using your arms or legs.  You develop any of the following: ? Shortness of breath. ? Dizziness. ? Pain in your legs. ? Weakness in your buttocks or legs. Summary  Lumbosacral strain is an injury that causes pain in the lower back (lumbosacral spine).  This injury usually happens from overstretching the muscles or ligaments along your spine.  This condition may be caused by a direct hit to the lower back or by overstretching the lower back muscles.  Symptoms usually improve within several weeks of treatment. This information is not intended to replace advice given to you by your health care provider. Make sure you discuss any questions you have with your health care provider. Document Revised: 04/20/2019 Document Reviewed: 04/20/2019 Elsevier Patient Education  2020 [employer].

## 2020-07-21 NOTE — Progress Notes (Signed)
   I, Christoper Fabian, LAT, ATC, am serving as scribe for Dr. Clementeen Nunez.  Chase Nunez is a 44 y.o. male who presents to [facility] today for chronic low back pain that has worsened x few weeks.  He was last seen by Dr. Denyse Nunez on 12/220 for [name] pain.  Since then, pt notes low back pain x few weeks.  He has been seen by his PCP and was prescribed Diclofenac 75mg .  He reports radiating pain into his abdomen but denies any radiating pain into his legs.  He denies any numbness/tingling into his legs.  Aggravating factors include first thing in the morning, prolonged sitting.  He has been taking IBU.  He teaches grappling and jujitsu.  Diagnostic imaging: L-spine XR- 04/16/19   Pertinent review of systems: No fevers or chills  Relevant historical information: Anxiety and OCD depression.   Exam:  BP 120/84 (BP Location: Right Arm, Patient Position: Sitting, Cuff Size: Normal)   Pulse 73   Ht 5\' 6"  (1.676 m)   Wt 206 lb 6.4 oz (93.6 kg)   SpO2 97%   BMI 33.31 kg/m  General: Well Developed, well nourished, and in no acute distress.   MSK: L-spine normal-appearing nontender midline.  Mildly tender palpation bilateral lumbar paraspinal musculature. Normal lumbar motion to flexion and rotation lateral flexion.  Slightly limited to extension. Lower extremity strength reflexes and sensation are equal normal throughout. Negative slump test.   Lab and Radiology Results EXAM: LUMBAR SPINE - COMPLETE 4+ VIEW  COMPARISON:  None.  FINDINGS: There is no evidence of lumbar spine fracture. Alignment is normal. Intervertebral disc spaces are maintained.  IMPRESSION: Negative.   Electronically Signed   By: 06/16/19 M.D.   On: 04/16/2019 13:23 I, [name], personally (independently) visualized and performed the interpretation of the images attached in this note.    Assessment and Plan: 44 y.o. male with lumbosacral strain.  Also  exacerbation of existing lumbosacral DJD mild chronic pain.  Plan to proceed with trial of physical therapy.  Core strengthening should definitely help.  Also recommend heating pad and TENS unit.  Recommend also over-the-counter medications as needed.  If not improving in about a month patient will notify me we will proceed with lumbar MRI for injection planning.    Orders Placed This Encounter  Procedures  . Ambulatory referral to Physical Therapy    Referral Priority:   Routine    Referral Type:   Physical Medicine    Referral Reason:   Specialty Services Required    Requested Specialty:   Physical Therapy   No orders of the defined types were placed in this encounter.    Discussed warning signs or symptoms. Please see discharge instructions. Patient expresses understanding.   The above documentation has been reviewed and is accurate and complete Chase Nunez, M.D.

## 2020-07-22 ENCOUNTER — Encounter: Payer: Self-pay | Admitting: Physician Assistant

## 2020-07-24 ENCOUNTER — Ambulatory Visit (INDEPENDENT_AMBULATORY_CARE_PROVIDER_SITE_OTHER): Payer: No Typology Code available for payment source | Admitting: Psychology

## 2020-07-24 DIAGNOSIS — F4321 Adjustment disorder with depressed mood: Secondary | ICD-10-CM | POA: Diagnosis not present

## 2020-08-18 ENCOUNTER — Ambulatory Visit (INDEPENDENT_AMBULATORY_CARE_PROVIDER_SITE_OTHER): Payer: No Typology Code available for payment source | Admitting: Psychology

## 2020-08-18 DIAGNOSIS — F4321 Adjustment disorder with depressed mood: Secondary | ICD-10-CM | POA: Diagnosis not present

## 2020-08-21 NOTE — Progress Notes (Signed)
Cardiology Office Note:   Date:  08/23/2020  NAME:  Chase Nunez    MRN: [id number] DOB:  05-09-1976   PCP:  Inda Coke, PA  Cardiologist:  No primary care provider on file.   Referring MD: Inda Coke, PA   Chief Complaint  Patient presents with  . Palpitations   History of Present Illness:   Chase Nunez is a 44 y.o. male with a hx of depression who is being seen today for the evaluation of palpitations/chest pain at the request of Inda Coke, [place]. TSH 3.57.  He presents with his wife.  For the past 2 to 3 months has had episodes of intermittent sweaty spells and chills.  He reports that he can feel his heart racing as well.  They can occur at any time.  He also reports some tightness in his chest when he gets these symptoms.  He does have night sweats as well.  Recent thyroid studies tested by his primary care physician are normal.  He still does maintain a high level of activity but has these episodes periodically.  No real identifiable trigger.  No alleviating factor reported.  His BMI is 31.  He is a former smoker.  He does consume considerable amount of liquor daily.  He reports 3-4 liquor drinks per day.  No withdrawal symptoms reported.  He also has troubles with depression for which he takes Prozac 80 mg daily.  He does exercise doing jujitsu and mountain biking.  Apparently does not have any chest pain or symptoms when he does this.  He reports that his father had heart attacks that began in his 56s.  He denies any chest pain or shortness of breath today.  His EKG demonstrates normal sinus rhythm with an incomplete RBBB.   Problem List 1. HLD -T chol 202, HDL 75, LDL 110, TG 77  Past Medical History: Past Medical History:  Diagnosis Date  . Alcohol consuption of more than two drinks per day   . Anxiety   . Arthritis   . Depression   . H/O drug abuse ([place])    none per patient since age 34  . History of chicken pox     Past Surgical History: Past Surgical  History:  Procedure Laterality Date  . SHOULDER ARTHROSCOPY      Current Medications: Current Meds  Medication Sig  . diclofenac sodium (VOLTAREN) 1 % GEL Apply 4 g topically 4 (four) times daily. To affected joint. (Patient taking differently: Apply 4 g topically as needed. To affected joint.)  . FLUoxetine (PROZAC) 40 MG capsule Take 2 capsules (80 mg total) by mouth daily.  Marland Kitchen ibuprofen (ADVIL,MOTRIN) 200 MG tablet Take 600-800 mg by mouth as needed.  . Multiple Vitamins-Minerals (MULTIVITAMIN WITH MINERALS) tablet Take 1 tablet by mouth daily.  . Omega-3 Fatty Acids (FISH OIL) 1000 MG CAPS Take by mouth.  . pantoprazole (PROTONIX) 40 MG tablet Take 1 tablet (40 mg total) by mouth daily.  Marland Kitchen UNABLE TO FIND Take 3 capsules by mouth at bedtime. Med Name: ZMA Tech (Vitamin B6, Magnesium and Zinc)     Allergies:    Patient has no known allergies.   Social History: Social History   Socioeconomic History  . Marital status: Married    Spouse name: Kenney Houseman  . Number of children: 1  . Years of education: Not on file  . Highest education level: Not on file  Occupational History  . Not on file  Tobacco Use  . Smoking status:  Former Smoker  . Smokeless tobacco: Current User    Types: Snuff  Vaping Use  . Vaping Use: Never used  Substance and Sexual Activity  . Alcohol use: Yes    Alcohol/week: 10.0 standard drinks    Types: 10 Shots of liquor per week  . Drug use: No  . Sexual activity: Yes  Other Topics Concern  . 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 Difficulty of Paying Living Expenses: Not on file  Food Insecurity:   . Worried About Charity fundraiser in the Last Year: Not on file  . Ran Out of Food in the Last Year: Not on file  Transportation Needs:   . Lack of Transportation (Medical): Not on file  . Lack of  Transportation (Non-Medical): Not on file  Physical Activity:   . Days of Exercise per Week: Not on file  . Minutes of Exercise per Session: Not on file  Stress:   . Feeling of Stress : Not on file  Social Connections:   . Frequency of Communication with Friends and Family: Not on file  . Frequency of Social Gatherings with Friends and Family: Not on file  . Attends Religious Services: Not on file  . Active Member of Clubs or Organizations: Not on file  . Attends Archivist Meetings: Not on file  . Marital Status: Not on file     Family History: The patient's family history includes Alcohol abuse in his father; Arthritis in his father; Birth defects in his mother; COPD in his maternal grandfather and maternal grandmother; Diabetes in his paternal grandfather and paternal grandmother; Drug abuse in his father; Heart disease in his father, maternal grandfather, paternal grandfather, and paternal grandmother; High Cholesterol in his father; Hyperlipidemia in his maternal grandfather and paternal grandfather; Hypertension in his father; Kidney disease in his paternal grandmother; Miscarriages / [place] in his mother; Thyroid disease in his mother. There is no history of Colon cancer or Prostate cancer.  ROS:   All other ROS reviewed and negative. Pertinent positives noted in the HPI.     EKGs/Labs/Other Studies Reviewed:   The following studies were personally reviewed by me today:  EKG:  EKG is ordered today.  The ekg ordered today demonstrates normal sinus rhythm, heart rate 61, incomplete right bundle branch block noted, and was personally reviewed by me.   Recent Labs: 07/17/2020: ALT 20; BUN 14; Creat 0.93; Hemoglobin 15.3; Platelets 246; Potassium 4.1; Sodium 136; TSH 3.57   Recent Lipid Panel    Component Value Date/Time   CHOL 202 (H) 07/17/2020 1456   CHOL 181 12/23/2017 1113   TRIG 77 07/17/2020 1456   HDL 75 07/17/2020 1456   HDL 58 12/23/2017 1113   CHOLHDL  2.7 07/17/2020 1456   VLDL 9.6 03/05/2019 1053   LDLCALC 110 (H) 07/17/2020 1456    Physical Exam:   VS:  BP 134/82   Pulse 61   Ht 5' 6"  (1.676 m)   Wt 194 lb (88 kg)   BMI 31.31 kg/m    Wt Readings from Last 3 Encounters:  08/23/20 194 lb (88 kg)  07/21/20 206 lb 6.4 oz (93.6 kg)  07/17/20 200 lb 9.6 oz (91 kg)    General: Well nourished, well developed, in no acute distress Heart: Atraumatic, normal  size  Eyes: PEERLA, EOMI  Neck: Supple, no JVD Endocrine: No thryomegaly Cardiac: Normal S1, S2; RRR; no murmurs, rubs, or gallops Lungs: Clear to auscultation bilaterally, no wheezing, rhonchi or rales  Abd: Soft, nontender, no hepatomegaly  Ext: No edema, pulses 2+ Musculoskeletal: No deformities, BUE and BLE strength normal and equal Skin: Warm and dry, no rashes   Neuro: Alert and oriented to person, place, time, and situation, CNII-XII grossly intact, no focal deficits  Psych: Normal mood and affect   ASSESSMENT:   Gawain Crombie is a 44 y.o. male who presents for the following: 1. Palpitations   2. Chest pain of uncertain etiology     PLAN:   1. Palpitations 2. Chest pain of uncertain etiology -He complains of intermittent episodes of sweatiness, cold sensation, palpitations.  Symptoms can occur at any time.  He also has associated chest tightness.  He does consume a lot of alcohol but no withdrawal symptoms reported.  Recent thyroid studies are normal.  His EKG today demonstrates normal sinus rhythm with no acute ischemic changes.  His cardiovascular examination is benign.  He reports he is unclear if his blood pressure is elevated during his episodes.  I did discuss that he should check it.  I would like to proceed with a 7-day Zio patch to exclude any significant arrhythmia to explain his symptoms.  His symptoms could just be depression related.  His CVD risk is include excessive alcohol use and strong family history.  He would like to proceed with coronary CTA.  He will  take 25 mg metoprolol tartrate 2 hours before the scan.  His resting heart rate is in the 60s.  He will need to give [place] a BMP today.  I will see him back in 3 months to reevaluate symptoms.   Disposition: Return in about 3 months (around 11/22/2020).  Medication Adjustments/Labs and Tests Ordered: Current medicines are reviewed at length with the patient today.  Concerns regarding medicines are outlined above.  Orders Placed This Encounter  Procedures  . CT CORONARY MORPH W/CTA COR W/SCORE W/CA W/CM &/OR WO/CM  . CT CORONARY FRACTIONAL FLOW RESERVE DATA PREP  . CT CORONARY FRACTIONAL FLOW RESERVE FLUID ANALYSIS  . Basic metabolic panel  . LONG TERM MONITOR (3-14 DAYS)  . EKG 12-Lead   Meds ordered this encounter  Medications  . metoprolol tartrate (LOPRESSOR) 25 MG tablet    Sig: Take 1 tablet by mouth once for procedure.    Dispense:  1 tablet    Refill:  0    Patient Instructions  Medication Instructions:  Take Metoprolol 25 mg two hours before CT when scheduled.   *If you need a refill on your cardiac medications before your next appointment, please call your pharmacy*   Lab Work: BMET today   If you have labs (blood work) drawn today and your tests are completely normal, you will receive your results only by: Marland Kitchen MyChart Message (if you have MyChart) OR . A paper copy in the mail If you have any lab test that is abnormal or we need to change your treatment, we will call you to review the results.   Testing/Procedures: Your physician has requested that you have cardiac CT. Cardiac computed tomography (CT) is a painless test that uses an x-ray machine to take clear, detailed pictures of your heart. For further information please visit [email or web address].[email or web address]. Please follow instruction sheet as given.   Your physician has recommended that you wear a 7 DAY ZIO-PATCH monitor.  The Zio patch cardiac monitor continuously records heart rhythm data for up to 14 days, this is for  patients being evaluated for multiple types heart rhythms. For the first 24 hours post application, please avoid getting the Zio monitor wet in the shower or by excessive sweating during exercise. After that, feel free to carry on with regular activities. Keep soaps and lotions away from the ZIO XT Patch.  This will be mailed to you, please expect 7-10 days to receive.    Applying the monitor   Shave hair from upper left chest.   Hold abrader disc by orange tab.  Rub abrader in 40 strokes over left upper chest as indicated in your monitor instructions.   Clean area with 4 enclosed alcohol pads .  Use all pads to assure are is cleaned thoroughly.  Let dry.   Apply patch as indicated in monitor instructions.  Patch will be place under collarbone on left side of chest with arrow pointing upward.   Rub patch adhesive wings for 2 minutes.Remove white label marked "1".  Remove white label marked "2".  Rub patch adhesive wings for 2 additional minutes.   While looking in a mirror, press and release button in center of patch.  A small green light will flash 3-4 times .  This will be your only indicator the monitor has been turned on.     Do not shower for the first 24 hours.  You may shower after the first 24 hours.   Press button if you feel a symptom. You will hear a small click.  Record Date, Time and Symptom in the Patient Log Book.   When you are ready to remove patch, follow instructions on last 2 pages of Patient Log Book.  Stick patch monitor onto last page of Patient Log Book.   Place Patient Log Book in [place] box.  Use locking tab on box and tape box closed securely.  The [employer] and [employer] has [employer] on it.  Please place in mailbox as soon as possible.  Your physician should have your test results approximately 7 days after the monitor has been mailed back to [facility].   Call [place] at [phone] if you have questions regarding your ZIO XT patch  monitor.  Call them immediately if you see an orange light blinking on your monitor.   If your monitor falls off in less than 4 days contact our Monitor department at [phone].  If your monitor becomes loose or falls off after 4 days call [employer] at [phone] for suggestions on securing your monitor     Follow-Up: At [facility], you and your health needs are our priority.  As part of our continuing mission to provide you with exceptional heart care, we have created designated Provider Care Teams.  These Care Teams include your primary Cardiologist (physician) and Advanced Practice Providers (APPs -  Physician Assistants and Nurse Practitioners) who all work together to provide you with the care you need, when you need it.  We recommend signing up for the patient portal called "MyChart".  Sign up information is provided on this After Visit Summary.  MyChart is used to connect with patients for Virtual Visits (Telemedicine).  Patients are able to view lab/test results, encounter notes, upcoming appointments, etc.  Non-urgent messages can be sent to your provider as well.   To learn more about what you can do with MyChart, go to [email or web address].    Your next appointment:  3 month(s)  The format for your next appointment:   In Person  Provider:   Eleonore Chiquito, MD   Other Instructions  Your cardiac CT will be scheduled at one of the below locations:   [facility] [address] [place], [place] [address] [phone]  [place] [address] [place], [place] [address] [phone]  If scheduled at [facility], please arrive at the [facility] main entrance of [facility] 30 minutes prior to test start time. Proceed to the [facility] Radiology Department (first floor) to check-in and test prep.  If scheduled at [facility], please arrive 15 mins early  for check-in and test prep.  Please follow these instructions carefully (unless otherwise directed):  Hold all erectile dysfunction medications at least 3 days (72 hrs) prior to test.  On the Night Before the Test: . Be sure to Drink plenty of water. . Do not consume any caffeinated/decaffeinated beverages or chocolate 12 hours prior to your test. . Do not take any antihistamines 12 hours prior to your test.  On the Day of the Test: . Drink plenty of water. Do not drink any water within one hour of the test. . Do not eat any food 4 hours prior to the test. . You may take your regular medications prior to the test.  . Take metoprolol (Lopressor) two hours prior to test. . HOLD Furosemide/Hydrochlorothiazide morning of the test. . FEMALES- please wear underwire-free bra if available  After the Test: . Drink plenty of water. . After receiving IV contrast, you may experience a mild flushed feeling. This is normal. . On occasion, you may experience a mild rash up to 24 hours after the test. This is not dangerous. If this occurs, you can take Benadryl 25 mg and increase your fluid intake. . If you experience trouble breathing, this can be serious. If it is severe call 911 IMMEDIATELY. If it is mild, please call our office. . If you take any of these medications: Glipizide/Metformin, Avandament, Glucavance, please do not take 48 hours after completing test unless otherwise instructed.   Once we have confirmed authorization from your insurance company, we will call you to set up a date and time for your test. Based on how quickly your insurance processes prior authorizations requests, please allow up to 4 weeks to be contacted for scheduling your Cardiac CT appointment. Be advised that routine Cardiac CT appointments could be scheduled as many as 8 weeks after your provider has ordered it.  For non-scheduling related questions, please contact the cardiac imaging nurse navigator should you have  any questions/concerns: Marchia Bond, Cardiac Imaging Nurse Navigator Burley Saver, Interim Cardiac Imaging Nurse [place] and Vascular Services Direct Office Dial: [phone]   For scheduling needs, including cancellations and rescheduling, please call Vivien Rota at [phone], option 3.       Signed, Addison Naegeli. Audie Box, [place]  [address] [place],  [address] [phone]  08/23/2020 4:02 PM

## 2020-08-23 ENCOUNTER — Encounter: Payer: Self-pay | Admitting: Cardiovascular Disease

## 2020-08-23 ENCOUNTER — Ambulatory Visit (INDEPENDENT_AMBULATORY_CARE_PROVIDER_SITE_OTHER): Payer: No Typology Code available for payment source | Admitting: Cardiovascular Disease

## 2020-08-23 ENCOUNTER — Encounter: Payer: Self-pay | Admitting: *Deleted

## 2020-08-23 ENCOUNTER — Other Ambulatory Visit: Payer: Self-pay

## 2020-08-23 VITALS — BP 134/82 | HR 61 | Ht 66.0 in | Wt 194.0 lb

## 2020-08-23 DIAGNOSIS — R002 Palpitations: Secondary | ICD-10-CM | POA: Diagnosis not present

## 2020-08-23 DIAGNOSIS — R079 Chest pain, unspecified: Secondary | ICD-10-CM

## 2020-08-23 MED ORDER — METOPROLOL TARTRATE 25 MG PO TABS: ORAL_TABLET | ORAL | 0 refills | Status: DC

## 2020-08-23 MED FILL — METOPROLOL TARTRATE 25 MG T: 25 | 1 days supply | Qty: 1 | Fill #0

## 2020-08-23 NOTE — Progress Notes (Signed)
Patient ID: Chase Nunez, male   DOB: 07-21-1976, 44 y.o.   MRN: [id number] Patient enrolled for [employer] to ship a 7 day, (per office note), ZIO XT long term holter monitor to his home.

## 2020-08-23 NOTE — Patient Instructions (Signed)
Medication Instructions:  Take Metoprolol 25 mg two hours before CT when scheduled.   *If you need a refill on your cardiac medications before your next appointment, please call your pharmacy*   Lab Work: BMET today   If you have labs (blood work) drawn today and your tests are completely normal, you will receive your results only by: Marland Kitchen MyChart Message (if you have MyChart) OR . A paper copy in the mail If you have any lab test that is abnormal or we need to change your treatment, we will call you to review the results.   Testing/Procedures: Your physician has requested that you have cardiac CT. Cardiac computed tomography (CT) is a painless test that uses an x-ray machine to take clear, detailed pictures of your heart. For further information please visit HugeFiesta.tn. Please follow instruction sheet as given.   Your physician has recommended that you wear a 7 DAY ZIO-PATCH monitor. The Zio patch cardiac monitor continuously records heart rhythm data for up to 14 days, this is for patients being evaluated for multiple types heart rhythms. For the first 24 hours post application, please avoid getting the Zio monitor wet in the shower or by excessive sweating during exercise. After that, feel free to carry on with regular activities. Keep soaps and lotions away from the ZIO XT Patch.  This will be mailed to you, please expect 7-10 days to receive.    Applying the monitor   Shave hair from upper left chest.   Hold abrader disc by orange tab.  Rub abrader in 40 strokes over left upper chest as indicated in your monitor instructions.   Clean area with 4 enclosed alcohol pads .  Use all pads to assure are is cleaned thoroughly.  Let dry.   Apply patch as indicated in monitor instructions.  Patch will be place under collarbone on left side of chest with arrow pointing upward.   Rub patch adhesive wings for 2 minutes.Remove white label marked "1".  Remove white label marked "2".  Rub  patch adhesive wings for 2 additional minutes.   While looking in a mirror, press and release button in center of patch.  A small green light will flash 3-4 times .  This will be your only indicator the monitor has been turned on.     Do not shower for the first 24 hours.  You may shower after the first 24 hours.   Press button if you feel a symptom. You will hear a small click.  Record Date, Time and Symptom in the Patient Log Book.   When you are ready to remove patch, follow instructions on last 2 pages of Patient Log Book.  Stick patch monitor onto last page of Patient Log Book.   Place Patient Log Book in [place] box.  Use locking tab on box and tape box closed securely.  The [employer] has [employer] on it.  Please place in mailbox as soon as possible.  Your physician should have your test results approximately 7 days after the monitor has been mailed back to [facility].   Call [place] at [phone] if you have questions regarding your ZIO XT patch monitor.  Call them immediately if you see an orange light blinking on your monitor.   If your monitor falls off in less than 4 days contact our Monitor department at [phone].  If your monitor becomes loose or falls off after 4 days call [employer] at [phone] for suggestions  on securing your monitor     Follow-Up: At [facility], you and your health needs are our priority.  As part of our continuing mission to provide you with exceptional heart care, we have created designated Provider Care Teams.  These Care Teams include your primary Cardiologist (physician) and Advanced Practice Providers (APPs -  Physician Assistants and Nurse Practitioners) who all work together to provide you with the care you need, when you need it.  We recommend signing up for the patient portal called "MyChart".  Sign up information is provided on this After Visit Summary.  MyChart is used to connect with patients for  Virtual Visits (Telemedicine).  Patients are able to view lab/test results, encounter notes, upcoming appointments, etc.  Non-urgent messages can be sent to your provider as well.   To learn more about what you can do with MyChart, go to [email or web address].    Your next appointment:   3 month(s)  The format for your next appointment:   In Person  Provider:   Eleonore Chiquito, MD   Other Instructions  Your cardiac CT will be scheduled at one of the below locations:   [facility] [address] [place], [place] [address] [phone]  [place] [address] [place], [place] [address] [phone]  If scheduled at [facility], please arrive at the [facility] main entrance of [facility] 30 minutes prior to test start time. Proceed to the [facility] Radiology Department (first floor) to check-in and test prep.  If scheduled at [facility] please arrive 15 mins early for check-in and test prep.  Please follow these instructions carefully (unless otherwise directed):  Hold all erectile dysfunction medications at least 3 days (72 hrs) prior to test.  On the Night Before the Test: . Be sure to Drink plenty of water. . Do not consume any caffeinated/decaffeinated beverages or chocolate 12 hours prior to your test. . Do not take any antihistamines 12 hours prior to your test.  On the Day of the Test: . Drink plenty of water. Do not drink any water within one hour of the test. . Do not eat any food 4 hours prior to the test. . You may take your regular medications prior to the test.  . Take metoprolol (Lopressor) two hours prior to test. . HOLD Furosemide/Hydrochlorothiazide morning of the test. . FEMALES- please wear underwire-free bra if available  After the Test: . Drink plenty of water. . After receiving IV contrast, you may experience a mild flushed feeling.  This is normal. . On occasion, you may experience a mild rash up to 24 hours after the test. This is not dangerous. If this occurs, you can take Benadryl 25 mg and increase your fluid intake. . If you experience trouble breathing, this can be serious. If it is severe call 911 IMMEDIATELY. If it is mild, please call our office. . If you take any of these medications: Glipizide/Metformin, Avandament, Glucavance, please do not take 48 hours after completing test unless otherwise instructed.   Once we have confirmed authorization from your insurance company, we will call you to set up a date and time for your test. Based on how quickly your insurance processes prior authorizations requests, please allow up to 4 weeks to be contacted for scheduling your Cardiac CT appointment. Be advised that routine Cardiac CT appointments could be scheduled as many as 8 weeks after your provider has  ordered it.  For non-scheduling related questions, please contact the cardiac imaging nurse navigator should you have any questions/concerns: Marchia Bond, Cardiac Imaging Nurse Navigator Burley Saver, Interim Cardiac Imaging Nurse [place] and Vascular Services Direct Office Dial: [phone]   For scheduling needs, including cancellations and rescheduling, please call Vivien Rota at [phone], option 3.

## 2020-08-24 LAB — BASIC METABOLIC PANEL
BUN/Creatinine Ratio: 16 (ref 9–20)
BUN: 16 mg/dL (ref 6–24)
CO2: 24 mmol/L (ref 20–29)
Calcium: 9.7 mg/dL (ref 8.7–10.2)
Chloride: 101 mmol/L (ref 96–106)
Creatinine, Ser: 1.01 mg/dL (ref 0.76–1.27)
GFR calc Af Amer: 106 mL/min/{1.73_m2} (ref >59)
GFR calc non Af Amer: 91 mL/min/{1.73_m2} (ref >59)
Glucose: 87 mg/dL (ref 65–99)
Potassium: 4.3 mmol/L (ref 3.5–5.2)
Sodium: 139 mmol/L (ref 134–144)

## 2020-08-27 ENCOUNTER — Ambulatory Visit (INDEPENDENT_AMBULATORY_CARE_PROVIDER_SITE_OTHER): Payer: No Typology Code available for payment source

## 2020-08-27 DIAGNOSIS — R002 Palpitations: Secondary | ICD-10-CM

## 2020-09-04 ENCOUNTER — Telehealth (HOSPITAL_COMMUNITY): Payer: Self-pay | Admitting: Emergency Medicine

## 2020-09-04 NOTE — Telephone Encounter (Signed)
Reaching out to patient to offer assistance regarding upcoming cardiac imaging study; pt verbalizes understanding of appt date/time, parking situation and where to check in, pre-test NPO status and medications ordered, and verified current allergies; name and call back number provided for further questions should they arise Rockwell Alexandria RN Navigator Cardiac Imaging [facility] [phone] office [phone] cell   Pt instructed not to take ED medications prior to test, taking metoprolol 2 hr prior to scan

## 2020-09-06 ENCOUNTER — Other Ambulatory Visit: Payer: Self-pay

## 2020-09-06 ENCOUNTER — Ambulatory Visit (HOSPITAL_COMMUNITY)
Admission: RE | Admit: 2020-09-06 | Discharge: 2020-09-06 | Disposition: A | Discharge status: Home / Self Care | Payer: No Typology Code available for payment source | Source: Ambulatory Visit | Attending: Cardiovascular Disease | Admitting: Cardiovascular Disease

## 2020-09-06 DIAGNOSIS — R079 Chest pain, unspecified: Secondary | ICD-10-CM | POA: Insufficient documentation

## 2020-09-06 MED ORDER — NITROGLYCERIN 0.4 MG SL SUBL
SUBLINGUAL_TABLET | SUBLINGUAL | Status: AC
  Filled 2020-09-06: qty 2

## 2020-09-06 MED ORDER — NITROGLYCERIN 0.4 MG SL SUBL
0.8000 mg | SUBLINGUAL_TABLET | SUBLINGUAL | Status: DC | PRN
  Administered 2020-09-06: 0.8 mg via SUBLINGUAL

## 2020-09-06 MED ORDER — IOHEXOL 350 MG/ML SOLN
80.0000 mL | Freq: Once | INTRAVENOUS | Status: AC | PRN
  Administered 2020-09-06: 80 mL via INTRAVENOUS

## 2020-09-08 ENCOUNTER — Other Ambulatory Visit: Payer: Self-pay

## 2020-09-08 MED ORDER — ASPIRIN EC 81 MG PO TBEC: 81.0000 mg | DELAYED_RELEASE_TABLET | Freq: Every day | ORAL | 3 refills | Status: DC

## 2020-09-08 MED ORDER — ROSUVASTATIN CALCIUM 20 MG PO TABS: 20.0000 mg | ORAL_TABLET | Freq: Every day | ORAL | 3 refills | Status: DC

## 2020-09-08 MED ORDER — PANTOPRAZOLE SODIUM 40 MG PO TBEC: 40.0000 mg | DELAYED_RELEASE_TABLET | Freq: Every day | ORAL | 3 refills | Status: DC

## 2020-09-08 MED FILL — ROSUVASTATIN CALCIUM 20 MG: 20 | 90 days supply | Qty: 90 | Fill #0

## 2020-09-08 MED FILL — ASPIRIN 81 MG TBEC: 81 | 90 days supply | Qty: 90 | Fill #0

## 2020-09-08 MED FILL — PANTOPRAZOLE SOD DR 40 MG T: 40 | 90 days supply | Qty: 90 | Fill #0

## 2020-09-11 ENCOUNTER — Ambulatory Visit (INDEPENDENT_AMBULATORY_CARE_PROVIDER_SITE_OTHER): Payer: No Typology Code available for payment source | Admitting: Psychology

## 2020-09-11 DIAGNOSIS — F4321 Adjustment disorder with depressed mood: Secondary | ICD-10-CM

## 2020-09-13 ENCOUNTER — Ambulatory Visit (INDEPENDENT_AMBULATORY_CARE_PROVIDER_SITE_OTHER): Payer: No Typology Code available for payment source | Admitting: Physical Therapy

## 2020-09-13 ENCOUNTER — Encounter: Payer: Self-pay | Admitting: Physical Therapy

## 2020-09-13 ENCOUNTER — Other Ambulatory Visit: Payer: Self-pay

## 2020-09-13 DIAGNOSIS — M545 Low back pain, unspecified: Secondary | ICD-10-CM | POA: Diagnosis not present

## 2020-09-13 NOTE — Patient Instructions (Signed)
Access Code: [email or web address] URL: [email or web address] Date: 09/13/2020 Prepared by: Sedalia Muta  Exercises Child's Pose with Sidebending - 2 x daily - 3 reps - 30 hold Supine Single Knee to Chest Stretch - 2 x daily - 3 reps - 30 hold Supine Piriformis Stretch Pulling Heel to Hip - 2 x daily - 3 reps - 30 hold Supine Lower Trunk Rotation - 2 x daily - 2 sets - 10 reps - 10 hold Supine Bridge - 1 x daily - 2 sets - 10 reps Standard Plank - 1 x daily - 2 sets - 3 reps - 30 hold Bird Dog - 1 x daily - 2 sets - 10 reps

## 2020-09-17 ENCOUNTER — Encounter: Payer: Self-pay | Admitting: Physical Therapy

## 2020-09-17 NOTE — Therapy (Signed)
[facility] [address] [place], [place], [address] Phone: [phone]   Fax:  [phone]  Physical Therapy Evaluation  Patient Details  Name: Chase Nunez MRN: [id number] Date of Birth: 1976-10-20 Referring Provider (PT): Clementeen Graham   Encounter Date: 09/13/2020   PT End of Session - 09/17/20 1911    Visit Number 1    Number of Visits 12    Date for PT Re-Evaluation 10/25/20    Authorization Type [facility]    PT Start Time 1215    PT Stop Time 1255    PT Time Calculation (min) 40 min    Activity Tolerance Patient tolerated treatment well    Behavior During Therapy [facility] for tasks assessed/performed           Past Medical History:  Diagnosis Date  . Alcohol consuption of more than two drinks per day   . Anxiety   . Arthritis   . Depression   . H/O drug abuse (HCC)    none per patient since age 33  . History of chicken pox     Past Surgical History:  Procedure Laterality Date  . SHOULDER ARTHROSCOPY      There were no vitals filed for this visit.    Subjective Assessment - 09/17/20 1909    Subjective Increased back pain since August, has improved but not resolved. Pain in bil lower back. Has not had back pain in the past. Works at [employer], has not been working out much, but has been doing Airline pilot and moving work at [employer] Notes increased pain fist thing in am, and when riding in car, drives 1 hr to work each day.    Limitations Lifting;Walking;House hold activities;Sitting    Patient Stated Goals decreased pain    Currently in Pain? Yes    Pain Score 6     Pain Location Back    Pain Orientation Right;Left    Pain Descriptors / Indicators Aching    Pain Type Acute pain    Pain Onset More than a month ago    Pain Frequency Intermittent    Aggravating Factors  First thing in AM, riding in car              [facility] PT Assessment - 09/17/20 0001      Assessment   Medical Diagnosis Low Back Pain     Referring Provider (PT) Clementeen Graham    Prior Therapy for shoulders      Balance Screen   Has the patient fallen in the past 6 months No      Prior Function   Level of Independence Independent      Cognition   Overall Cognitive Status Within Functional Limits for tasks assessed      Posture/Postural Control   Posture Comments poor seated posture, PPT       AROM   Overall AROM Comments Lumbar : WNL mild pain with flex and ext, HIps: WNL      Strength   Overall Strength Comments Hips: 4+/5, Core: 4/5       Palpation   Palpation comment Mild muscle tension in bil lumbar praspinals, L >R , and in L QL. , no pain in cetnral lumbar region or SI, no pain /trigger points in glutes       Special Tests   Other special tests Neg SLR  Objective measurements completed on examination: See above findings.       [facility] Adult PT Treatment/Exercise - 09/17/20 0001      Exercises   Exercises Lumbar      Lumbar Exercises: Stretches   Active Hamstring Stretch Limitations add next visit     Single Knee to Chest Stretch 3 reps;30 seconds    Lower Trunk Rotation 5 reps;10 seconds    Piriformis Stretch 3 reps;30 seconds    Piriformis Stretch Limitations mod fig 4 supine     Other Lumbar Stretch Exercise childs post L/R/center x 2 min;       Lumbar Exercises: Standing   Functional Squats 10 reps    Functional Squats Limitations education on form       Lumbar Exercises: Supine   Bridge 20 reps                  PT Education - 09/17/20 1911    Education Details PT POC, Exam findings, HEP    Person(s) Educated Patient    Methods Explanation;Demonstration;Tactile cues;Verbal cues;Handout    Comprehension Verbalized understanding;Returned demonstration;Verbal cues required;Tactile cues required;Need further instruction            PT Short Term Goals - 09/17/20 1914      PT SHORT TERM GOAL #1   Title Pt to be independent with initial HEP     Time 2    Period Weeks    Target Date 09/27/20             PT Long Term Goals - 09/17/20 1914      PT LONG TERM GOAL #1   Title Pt to be independent with final HEP    Time 6    Period Weeks    Status New    Target Date 10/25/20      PT LONG TERM GOAL #2   Title Pt to report decreased pain in low back to 0-2/10 with driving, sitting, work duties and IADLs.    Time 6    Period Weeks    Status New    Target Date 10/25/20      PT LONG TERM GOAL #3   Title Pt to demo lumbar ROM to be full and pain free, to improve ability for IADLS.    Time 6    Period Weeks    Status New    Target Date 10/25/20      PT LONG TERM GOAL #4   Title Pt to demo proper mechanics for bend, lift, squat, for safety with IADLs and work duties.    Time 6    Period Weeks    Status New    Target Date 10/25/20                  Plan - 09/17/20 1917    Clinical Impression Statement Pt presents wtih primary complaint of increased pain in lumbar region. Pt with muscle tension and soreness in bil paraspinals and QL region. He has lack of effective HEP for Dx. Pt with decreased ability for full functional activities due to pain. Pt to benefit from skilled PT to improve deficits and pain.    Examination-Activity Limitations Locomotion Level;Bend;Sit;Squat;Stand;Lift    Examination-Participation Restrictions Yard Work;Cleaning;Community Activity;Occupation;Driving;Shop    Stability/Clinical Decision Making Stable/Uncomplicated    Clinical Decision Making Low    Rehab Potential Good    PT Frequency 2x / week    PT Duration 6 weeks    PT Treatment/Interventions ADLs/Self Care  Home Management;Cryotherapy;Electrical Stimulation;DME Instruction;Ultrasound;Traction;Moist Heat;Iontophoresis 4mg /ml Dexamethasone;Gait training;Stair training;Functional mobility training;Therapeutic activities;Therapeutic exercise;Balance training;Patient/family education;Neuromuscular re-education;Manual techniques;Taping;Dry  needling;Passive range of motion;Spinal Manipulations;Joint Manipulations    Consulted and Agree with Plan of Care Patient           Patient will benefit from skilled therapeutic intervention in order to improve the following deficits and impairments:  Abnormal gait, Pain, Improper body mechanics, Decreased mobility, Increased muscle spasms, Decreased strength, Decreased range of motion, Decreased activity tolerance, Impaired flexibility  Visit Diagnosis: Acute bilateral low back pain without sciatica     Problem List Patient Active Problem List   Diagnosis Date Noted  . Alcohol consuption of more than two drinks per day   . Anxiety   . Depression   . Obsessive-compulsive disorder 12/17/2017  . Generalized anxiety disorder 12/17/2017    02/14/2018, PT, DPT 7:25 PM  09/17/20    [facility] [address] [place], [place], [place] Phone: [phone]   Fax:  [phone]  Name: Chase Nunez MRN: Kathreen Cosier Date of Birth: 04-May-1976

## 2020-09-25 ENCOUNTER — Other Ambulatory Visit: Payer: Self-pay

## 2020-09-25 ENCOUNTER — Ambulatory Visit (INDEPENDENT_AMBULATORY_CARE_PROVIDER_SITE_OTHER): Payer: No Typology Code available for payment source | Admitting: Physical Therapy

## 2020-09-25 ENCOUNTER — Encounter: Payer: Self-pay | Admitting: Physical Therapy

## 2020-09-25 DIAGNOSIS — M545 Low back pain, unspecified: Secondary | ICD-10-CM

## 2020-09-25 NOTE — Therapy (Signed)
[facility] [address] [place], [place], [address] Phone: [phone]   Fax:  [phone]  Physical Therapy Treatment  Patient Details  Name: Chase Nunez MRN: [id number] Date of Birth: 06-Sep-1976 Referring Provider (PT): Clementeen Graham   Encounter Date: 09/25/2020   PT End of Session - 09/25/20 1527    Visit Number 2    Number of Visits 12    Date for PT Re-Evaluation 10/25/20    Authorization Type [facility]    PT Start Time 1300    PT Stop Time 1342    PT Time Calculation (min) 42 min    Activity Tolerance Patient tolerated treatment well    Behavior During Therapy [facility] for tasks assessed/performed           Past Medical History:  Diagnosis Date  . Alcohol consuption of more than two drinks per day   . Anxiety   . Arthritis   . Depression   . H/O drug abuse (HCC)    none per patient since age 76  . History of chicken pox     Past Surgical History:  Procedure Laterality Date  . SHOULDER ARTHROSCOPY      There were no vitals filed for this visit.   Subjective Assessment - 09/25/20 1526    Subjective Pt reports minimal pain in back since last visit. Has been exercising some and doing HEP.    Currently in Pain? No/denies    Pain Score 0-No pain                             [facility] Adult PT Treatment/Exercise - 09/25/20 1307      Posture/Postural Control   Posture Comments poor seated posture, PPT       Exercises   Exercises Lumbar      Lumbar Exercises: Stretches   Active Hamstring Stretch Limitations --    Single Knee to Chest Stretch 3 reps;30 seconds    Lower Trunk Rotation --    Press Ups 5 reps    Piriformis Stretch 3 reps;30 seconds    Piriformis Stretch Limitations mod fig 4 supine     Other Lumbar Stretch Exercise childs post L/R/center x 2 min;       Lumbar Exercises: Aerobic   Recumbent Bike L3 x 8 min       Lumbar Exercises: Standing   Functional Squats 10 reps    Functional  Squats Limitations education on form     Lifting 15 reps    Lifting Weights (lbs) 45    Lifting Limitations Dead lift, education on proper form.     Row 20 reps    Theraband Level (Row) Level 4 (Blue)      Lumbar Exercises: Supine   Bridge --    Bridge with clamshell 20 reps    Bridge with [employer] Limitations Bl TB    Straight Leg Raise 10 reps    Straight Leg Raises Limitations with TA    Other Supine Lumbar Exercises bicycle x20 with TA      Lumbar Exercises: Sidelying   Hip Abduction 20 reps      Lumbar Exercises: Quadruped   Madcat/Old Horse 20 reps      Manual Therapy   Manual Therapy Joint mobilization;Soft tissue mobilization    Joint Mobilization PA mobs thoracic and lumbar spine     Soft tissue mobilization Palpation and STM for assessment of pain in thoracic  spine                     PT Short Term Goals - 09/17/20 1914      PT SHORT TERM GOAL #1   Title Pt to be independent with initial HEP    Time 2    Period Weeks    Target Date 09/27/20             PT Long Term Goals - 09/17/20 1914      PT LONG TERM GOAL #1   Title Pt to be independent with final HEP    Time 6    Period Weeks    Status New    Target Date 10/25/20      PT LONG TERM GOAL #2   Title Pt to report decreased pain in low back to 0-2/10 with driving, sitting, work duties and IADLs.    Time 6    Period Weeks    Status New    Target Date 10/25/20      PT LONG TERM GOAL #3   Title Pt to demo lumbar ROM to be full and pain free, to improve ability for IADLS.    Time 6    Period Weeks    Status New    Target Date 10/25/20      PT LONG TERM GOAL #4   Title Pt to demo proper mechanics for bend, lift, squat, for safety with IADLs and work duties.    Time 6    Period Weeks    Status New    Target Date 10/25/20                 Plan - 09/25/20 1529    Clinical Impression Statement Pt with minimal pain with activities today. Reviewed HEP, and progressed ther ex  for strengthening. 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 Mid tenderness wtih lumbar PAs, minimal muslce tension or pain wtih palpation. Pt progressing well, will work towards d/c if pt continues to have lass pain.    Examination-Activity Limitations Locomotion Level;Bend;Sit;Squat;Stand;Lift    Examination-Participation Restrictions Yard Work;Cleaning;Community Activity;Occupation;Driving;Shop    Stability/Clinical Decision Making Stable/Uncomplicated    Rehab Potential Good    PT Frequency 2x / week    PT Duration 6 weeks    PT Treatment/Interventions ADLs/Self Care Home Management;Cryotherapy;Electrical Stimulation;DME Instruction;Ultrasound;Traction;Moist Heat;Iontophoresis 4mg /ml Dexamethasone;Gait training;Stair training;Functional mobility training;Therapeutic activities;Therapeutic exercise;Balance training;Patient/family education;Neuromuscular re-education;Manual techniques;Taping;Dry needling;Passive range of motion;Spinal Manipulations;Joint Manipulations    Consulted and Agree with Plan of Care Patient           Patient will benefit from skilled therapeutic intervention in order to improve the following deficits and impairments:  Abnormal gait, Pain, Improper body mechanics, Decreased mobility, Increased muscle spasms, Decreased strength, Decreased range of motion, Decreased activity tolerance, Impaired flexibility  Visit Diagnosis: Acute bilateral low back pain without sciatica     Problem List Patient Active Problem List   Diagnosis Date Noted  . Alcohol consuption of more than two drinks per day   . Anxiety   . Depression   . Obsessive-compulsive disorder 12/17/2017  . Generalized anxiety disorder 12/17/2017    02/14/2018, PT, DPT 3:34 PM  09/25/20    [facility] [address] [place], [place], [place] Phone: [phone]   Fax:   [phone]  Name: Chase Nunez MRN: Chase Nunez Date of Birth: 1976/11/10

## 2020-10-02 ENCOUNTER — Encounter: Payer: No Typology Code available for payment source | Admitting: Physical Therapy

## 2020-10-09 ENCOUNTER — Other Ambulatory Visit: Payer: Self-pay

## 2020-10-09 ENCOUNTER — Encounter: Payer: Self-pay | Admitting: Physical Therapy

## 2020-10-09 ENCOUNTER — Ambulatory Visit (INDEPENDENT_AMBULATORY_CARE_PROVIDER_SITE_OTHER): Payer: No Typology Code available for payment source | Admitting: Physical Therapy

## 2020-10-09 DIAGNOSIS — M545 Low back pain, unspecified: Secondary | ICD-10-CM | POA: Diagnosis not present

## 2020-10-09 NOTE — Patient Instructions (Signed)
Access Code: [email or web address] URL: [email or web address] Date: 10/09/2020 Prepared by: Sedalia Muta  Exercises Child's Pose with Sidebending - 2 x daily - 3 reps - 30 hold Supine Single Knee to Chest Stretch - 2 x daily - 3 reps - 30 hold Supine Piriformis Stretch Pulling Heel to Hip - 2 x daily - 3 reps - 30 hold Cat-Camel - 1 x daily - 10 reps - 5 hold Supine Lower Trunk Rotation - 2 x daily - 2 sets - 10 reps - 10 hold Supine Bridge - 1 x daily - 2 sets - 10 reps Standard Plank - 1 x daily - 2 sets - 3 reps - 30 hold Bird Dog - 1 x daily - 2 sets - 10 reps Small Range Straight Leg Raise - 1 x daily - 2 sets - 10 reps Standing Row with Anchored Resistance - 1 x daily - 2 sets - 10 reps

## 2020-10-09 NOTE — Therapy (Signed)
[facility] [address] [place], [place], [address] Phone: [phone]   Fax:  [phone]  Physical Therapy Treatment/Discharge   Patient Details  Name: Chase Nunez MRN: [id number] Date of Birth: 1976/04/28 Referring Provider (PT): Lynne Leader   Encounter Date: 10/09/2020   PT End of Session - 10/09/20 1310    Visit Number 3    Number of Visits 12    Date for PT Re-Evaluation 10/25/20    Authorization Type [facility]    PT Start Time 1302    PT Stop Time 1340    PT Time Calculation (min) 38 min    Activity Tolerance Patient tolerated treatment well    Behavior During Therapy [facility] for tasks assessed/performed           Past Medical History:  Diagnosis Date  . Alcohol consuption of more than two drinks per day   . Anxiety   . Arthritis   . Depression   . H/O drug abuse ([place])    none per patient since age 69  . History of chicken pox     Past Surgical History:  Procedure Laterality Date  . SHOULDER ARTHROSCOPY      There were no vitals filed for this visit.   Subjective Assessment - 10/09/20 1307    Subjective Pt states minimal pain in back. Has been dong regular activities and work duties.    Currently in Pain? No/denies    Pain Score 0-No pain                             [facility] Adult PT Treatment/Exercise - 10/09/20 1311      Posture/Postural Control   Posture Comments poor seated posture, PPT       Exercises   Exercises Lumbar      Lumbar Exercises: Stretches   Single Knee to Chest Stretch 3 reps;30 seconds    Press Ups --    Piriformis Stretch 3 reps;30 seconds    Piriformis Stretch Limitations mod fig 4 supine     Other Lumbar Stretch Exercise childs post L/R/center x 2 min;       Lumbar Exercises: Aerobic   Recumbent Bike L3 x 8 min       Lumbar Exercises: Standing   Functional Squats --    Functional Squats Limitations --    Lifting --    Lifting Weights (lbs) --    Lifting  Limitations --    Row 20 reps    Theraband Level (Row) Level 4 (Blue)      Lumbar Exercises: Supine   Bent Knee Raise 20 reps    Bent Knee Raise Limitations Bl TB    Bridge with clamshell 20 reps    Bridge with Ball Squeeze Limitations Bl TB    Straight Leg Raise 10 reps    Straight Leg Raises Limitations with TA    Other Supine Lumbar Exercises bicycle x20 with TA      Lumbar Exercises: Sidelying   Hip Abduction --      Lumbar Exercises: Quadruped   Madcat/Old Horse 20 reps    Opposite Arm/Leg Raise 20 reps      Manual Therapy   Manual Therapy Joint mobilization;Soft tissue mobilization    Joint Mobilization PA mobs thoracic and lumbar spine     Soft tissue mobilization --  PT Short Term Goals - 10/09/20 1311      PT SHORT TERM GOAL #1   Title Pt to be independent with initial HEP    Time 2    Period Weeks    Status Achieved    Target Date 09/27/20             PT Long Term Goals - 10/09/20 1508      PT LONG TERM GOAL #1   Title Pt to be independent with final HEP    Time 6    Period Weeks    Status Achieved      PT LONG TERM GOAL #2   Title Pt to report decreased pain in low back to 0-2/10 with driving, sitting, work duties and IADLs.    Time 6    Period Weeks    Status Achieved      PT LONG TERM GOAL #3   Title Pt to demo lumbar ROM to be full and pain free, to improve ability for IADLS.    Time 6    Period Weeks    Status New      PT LONG TERM GOAL #4   Title Pt to demo proper mechanics for bend, lift, squat, for safety with IADLs and work duties.    Time 6    Period Weeks    Status Achieved                 Plan - 10/09/20 1325    Clinical Impression Statement Pt progressing well. No pain with activities today. Pt has met goals at this time, ready for d/c to HEP. Final HEP reviewed today.    Examination-Activity Limitations Locomotion Level;Bend;Sit;Squat;Stand;Lift    Examination-Participation Restrictions  Yard Work;Cleaning;Community Activity;Occupation;Driving;Shop    Stability/Clinical Decision Making Stable/Uncomplicated    Rehab Potential Good    PT Frequency 2x / week    PT Duration 6 weeks    PT Treatment/Interventions ADLs/Self Care Home Management;Cryotherapy;Electrical Stimulation;DME Instruction;Ultrasound;Traction;Moist Heat;Iontophoresis 38m/ml Dexamethasone;Gait training;Stair training;Functional mobility training;Therapeutic activities;Therapeutic exercise;Balance training;Patient/family education;Neuromuscular re-education;Manual techniques;Taping;Dry needling;Passive range of motion;Spinal Manipulations;Joint Manipulations    Consulted and Agree with Plan of Care Patient           Patient will benefit from skilled therapeutic intervention in order to improve the following deficits and impairments:  Abnormal gait, Pain, Improper body mechanics, Decreased mobility, Increased muscle spasms, Decreased strength, Decreased range of motion, Decreased activity tolerance, Impaired flexibility  Visit Diagnosis: Acute bilateral low back pain without sciatica     Problem List Patient Active Problem List   Diagnosis Date Noted  . Alcohol consuption of more than two drinks per day   . Anxiety   . Depression   . Obsessive-compulsive disorder 12/17/2017  . Generalized anxiety disorder 12/17/2017    LLyndee Hensen PT, DPT 3:08 PM  10/09/20    [email or web address] [address]Phone: [phone]  Fax:  [phone] Name: Chase LaforestMRN: [id number] of Birth: 7Sep 19, 1977  PHYSICAL THERAPY DISCHARGE SUMMARY  Visits from Start of Care: 3 Plan: Patient agrees to discharge.  Patient goals were met. Patient is being discharged due to meeting the stated rehab goals.  ?????     LLyndee Hensen PT, DPT 3:08 PM  10/09/20

## 2020-10-24 ENCOUNTER — Ambulatory Visit (INDEPENDENT_AMBULATORY_CARE_PROVIDER_SITE_OTHER): Payer: No Typology Code available for payment source | Admitting: Psychology

## 2020-10-24 DIAGNOSIS — F4321 Adjustment disorder with depressed mood: Secondary | ICD-10-CM

## 2020-10-24 MED FILL — FLUoxetine HCL 40 MG CAPS: 40 | 90 days supply | Qty: 180 | Fill #1

## 2020-11-01 ENCOUNTER — Ambulatory Visit (INDEPENDENT_AMBULATORY_CARE_PROVIDER_SITE_OTHER): Payer: No Typology Code available for payment source | Admitting: Physician Assistant

## 2020-11-01 ENCOUNTER — Other Ambulatory Visit: Payer: Self-pay

## 2020-11-01 ENCOUNTER — Encounter: Payer: Self-pay | Admitting: Physician Assistant

## 2020-11-01 VITALS — BP 130/84 | HR 61 | Temp 97.5°F | Ht 66.0 in | Wt 191.5 lb

## 2020-11-01 DIAGNOSIS — F321 Major depressive disorder, single episode, moderate: Secondary | ICD-10-CM | POA: Diagnosis not present

## 2020-11-01 DIAGNOSIS — L723 Sebaceous cyst: Secondary | ICD-10-CM

## 2020-11-01 NOTE — Progress Notes (Addendum)
Chase Nunez is a 44 y.o. male here for a new problem.  I acted as a Neurosurgeon for [employer], PA-C Corky Mull, LPN   History of Present Illness:   Chief Complaint  Patient presents with  . Cyst  . Depression    HPI   Cyst Pt c/o cyst on mid back area has been there a long time but is now bothering him the past 2-3 days. Area is red and irritated. His wife popped it open last night and got some purulent drainage out from it. Denies: fevers, chills.  Depression Pt has been having some issues with depression past few weeks. He says he feels overwhelmed, does not want to get out of bed and take care of things. Denies SI/HI. Did reduce his prozac to 40 mg daily for a few weeks after seeing cardiology and felt like this could have influenced his mood. Denies SI/HI. He has reduced alcohol and gotten more sleep but is having worsening symptoms despite these lifestyle changes.   Seeing Misty Stanley monthly.  Past Medical History:  Diagnosis Date  . Alcohol consuption of more than two drinks per day   . Anxiety   . Arthritis   . Depression   . H/O drug abuse (HCC)    none per patient since age 31  . History of chicken pox      Social History   Tobacco Use  . Smoking status: Former Games developer  . Smokeless tobacco: Current User    Types: Snuff  Vaping Use  . Vaping Use: Never used  Substance Use Topics  . Alcohol use: Yes    Alcohol/week: 10.0 standard drinks    Types: 10 Shots of liquor per week  . Drug use: No    Past Surgical History:  Procedure Laterality Date  . SHOULDER ARTHROSCOPY      Family History  Problem Relation Age of Onset  . Thyroid disease Mother   . Birth defects Mother   . Miscarriages / [place] Mother   . Heart disease Father   . Hypertension Father   . Alcohol abuse Father   . Arthritis Father   . Drug abuse Father   . High Cholesterol Father   . Heart disease Maternal Grandfather   . Hyperlipidemia Maternal Grandfather   . COPD Maternal  Grandfather   . Diabetes Paternal Grandmother   . Heart disease Paternal Grandmother   . Kidney disease Paternal Grandmother   . Heart disease Paternal Grandfather   . Diabetes Paternal Grandfather   . Hyperlipidemia Paternal Grandfather   . COPD Maternal Grandmother   . Colon cancer Neg Hx   . Prostate cancer Neg Hx     No Known Allergies  Current Medications:   Current Outpatient Medications:  .  aspirin EC 81 MG tablet, Take 1 tablet (81 mg total) by mouth daily. Swallow whole., Disp: 90 tablet, Rfl: 3 .  diclofenac sodium (VOLTAREN) 1 % GEL, Apply 4 g topically 4 (four) times daily. To affected joint. (Patient taking differently: Apply 4 g topically as needed. To affected joint.), Disp: 100 g, Rfl: 11 .  ibuprofen (ADVIL,MOTRIN) 200 MG tablet, Take 600-800 mg by mouth as needed., Disp: , Rfl:  .  Multiple Vitamins-Minerals (MULTIVITAMIN WITH MINERALS) tablet, Take 1 tablet by mouth daily., Disp: , Rfl:  .  Omega-3 Fatty Acids (FISH OIL) 1000 MG CAPS, Take by mouth., Disp: , Rfl:  .  pantoprazole (PROTONIX) 40 MG tablet, Take 1 tablet (40 mg total) by mouth daily.,  Disp: 30 tablet, Rfl: 3 .  rosuvastatin (CRESTOR) 20 MG tablet, Take 1 tablet (20 mg total) by mouth daily., Disp: 90 tablet, Rfl: 3 .  UNABLE TO FIND, Take 3 capsules by mouth at bedtime. Med Name: ZMA Tech (Vitamin B6, Magnesium and Zinc), Disp: , Rfl:  .  FLUoxetine (PROZAC) 40 MG capsule, Take 2 capsules (80 mg total) by mouth daily., Disp: 180 capsule, Rfl: 3   Review of Systems:   ROS Negative unless otherwise specified per HPI.  Vitals:   Vitals:   11/01/20 0733  BP: 130/84  Pulse: 61  Temp: (!) 97.5 F (36.4 C)  TempSrc: Temporal  SpO2: 96%  Weight: 191 lb 8 oz (86.9 kg)  Height: 5\' 6"  (1.676 m)     Body mass index is 30.91 kg/m.  Physical Exam:   Physical Exam Vitals and nursing note reviewed.  Constitutional:      General: He is not in acute distress.    Appearance: He is well-developed.  He is not ill-appearing or toxic-appearing.  Cardiovascular:     Rate and Rhythm: Normal rate and regular rhythm.     Pulses: Normal pulses.     Heart sounds: Normal heart sounds, S1 normal and S2 normal.     Comments: No LE edema Pulmonary:     Effort: Pulmonary effort is normal.     Breath sounds: Normal breath sounds.  Skin:    General: Skin is warm and dry.     Comments: Irritated erythematous lesion to mid back with tenderness  Neurological:     Mental Status: He is alert.     GCS: GCS eye subscore is 4. GCS verbal subscore is 5. GCS motor subscore is 6.  Psychiatric:        Speech: Speech normal.        Behavior: Behavior normal. Behavior is cooperative.    I&D Meds, vitals, and allergies reviewed.  Indication: inflamed abscess Pt complaints of: erythema, pain, swelling Location: mid back Size: 1/2 cm  Informed consent obtained.  Pt aware of risks not limited to but including infection, bleeding, damage to near by organs.  Prep: etoh/betadine  Anesthesia: 1%lidocaine with epi, good effect  Incision made with #11 blade  Wound explored and loculations removed, lesion was allowed to drain openly  Tolerated well    Assessment and Plan:   Levelle was seen today for cyst and depression.  Diagnoses and all orders for this visit:  Inflamed sebaceous cyst Tolerated procedure well without issues. Routine postprocedure instructions d/w pt- remove packing in 24-48h, keep area clean and bandaged, follow up if concerns/spreading erythema/pain.  Depression, major, single episode, moderate (HCC) Overall stable per patient. He would like to avoid medication changes at this time. Discussed that if symptoms persist, advised as follows: If you feel like your symptoms are getting worse (more depression), let's decrease prozac to 40 mg daily and add wellbtrin 150 mg daily. Please let me know if you want to do this. I discussed with patient that if they develop any SI, to tell  someone immediately and seek medical attention.  CMA or LPN served as scribe during this visit. History, Physical, and Plan performed by medical provider. The above documentation has been reviewed and is accurate and complete.  10-27-1978, PA-C

## 2020-11-01 NOTE — Patient Instructions (Addendum)
It was great to see you!  Continue current prozac regimen.  If you feel like your symptoms are getting worse (more depression), let's decrease prozac to 40 mg daily and add wellbtrin 150 mg daily. Please let me know if you want to do this.  Keep area clean and bandaged for the next 24 hours. If you develop any worsening redness, pain or other symptoms, please let me know.  Take care,  Jarold Motto PA-C

## 2020-11-08 ENCOUNTER — Ambulatory Visit (INDEPENDENT_AMBULATORY_CARE_PROVIDER_SITE_OTHER): Payer: No Typology Code available for payment source | Admitting: Psychology

## 2020-11-08 DIAGNOSIS — F4321 Adjustment disorder with depressed mood: Secondary | ICD-10-CM | POA: Diagnosis not present

## 2020-11-23 NOTE — Progress Notes (Deleted)
Cardiology Office Note:   Date:  11/23/2020  NAME:  Chase Nunez    MRN: [id number] DOB:  31-May-1976   PCP:  Jarold Motto, PA  Cardiologist:  No primary care provider on file.  Electrophysiologist:  None   Referring MD: Jarold Motto, PA   No chief complaint on file. ***  History of Present Illness:   Chase Nunez is a 44 y.o. male with a hx of hyperlipidemia, nonobstructive CAD (coronary calcium score 9 9th percentile) who presents for follow-up.  Was seen for atypical chest pain and palpitations.  CTA without arrhythmia.  Coronary CTA with nonobstructive disease.  Given his young age he is in 99th percentile.  Problem List 1. HLD -T chol 202, HDL 75, LDL 110, TG 77 2. Non-obstructive CAD -CAC 134 (99th percentile) -mild 25-49% LAD  Past Medical History: Past Medical History:  Diagnosis Date  . Alcohol consuption of more than two drinks per day   . Anxiety   . Arthritis   . Depression   . H/O drug abuse (HCC)    none per patient since age 24  . History of chicken pox     Past Surgical History: Past Surgical History:  Procedure Laterality Date  . SHOULDER ARTHROSCOPY      Current Medications: No outpatient medications have been marked as taking for the 11/27/20 encounter (Appointment) with O'Neal, Ronnald Ramp, MD.     Allergies:    Patient has no known allergies.   Social History: Social History   Socioeconomic History  . Marital status: Married    Spouse name: Archie Patten  . Number of children: 1  . Years of education: Not on file  . Highest education level: Not on file  Occupational History  . Not on file  Tobacco Use  . Smoking status: Former Games developer  . Smokeless tobacco: Current User    Types: Snuff  Vaping Use  . Vaping Use: Never used  Substance and Sexual Activity  . Alcohol use: Yes    Alcohol/week: 10.0 standard drinks    Types: 10 Shots of liquor per week  . Drug use: No  . Sexual activity: Yes  Other Topics Concern  . 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, molecular   Son 65 y/o in [employer]   Social Determinants of Health   Financial Resource Strain: Not on file  Food Insecurity: Not on file  Transportation Needs: Not on file  Physical Activity: Not on file  Stress: Not on file  Social Connections: Not on file     Family History: The patient's ***family history includes Alcohol abuse in his father; Arthritis in his father; Birth defects in his mother; COPD in his maternal grandfather and maternal grandmother; Diabetes in his paternal grandfather and paternal grandmother; Drug abuse in his father; Heart disease in his father, maternal grandfather, paternal grandfather, and paternal grandmother; High Cholesterol in his father; Hyperlipidemia in his maternal grandfather and paternal grandfather; Hypertension in his father; Kidney disease in his paternal grandmother; Miscarriages / [place] in his mother; Thyroid disease in his mother. There is no history of Colon cancer or Prostate cancer.  ROS:   All other ROS reviewed and negative. Pertinent positives noted in the HPI.     EKGs/Labs/Other Studies Reviewed:   The following studies were personally reviewed by me today:  EKG:  EKG is *** ordered today.  The ekg ordered today demonstrates ***, and was personally reviewed  by me.   Luci Bank 09/25/2020 Impression: 1. No arrhythmias detected.  2. Rare ectopy.   CCTA 09/06/2020 IMPRESSION: 1. Coronary calcium score of 134. This was 99th percentile for age and sex matched control.  2.  Normal coronary origin with right dominance.  3.  Mild atherosclerosis.  CAD RADS 2.  4.  Consider other causes of non-atherosclerotic chest pain.  5.  Recommend preventive therapy and risk factor modification.   Recent Labs: 07/17/2020: ALT 20; Hemoglobin 15.3; Platelets 246; TSH 3.57 08/23/2020: BUN 16; Creatinine, Ser 1.01; Potassium 4.3; Sodium 139   Recent Lipid  Panel    Component Value Date/Time   CHOL 202 (H) 07/17/2020 1456   CHOL 181 12/23/2017 1113   TRIG 77 07/17/2020 1456   HDL 75 07/17/2020 1456   HDL 58 12/23/2017 1113   CHOLHDL 2.7 07/17/2020 1456   VLDL 9.6 03/05/2019 1053   LDLCALC 110 (H) 07/17/2020 1456    Physical Exam:   VS:  There were no vitals taken for this visit.   Wt Readings from Last 3 Encounters:  11/01/20 191 lb 8 oz (86.9 kg)  08/23/20 194 lb (88 kg)  07/21/20 206 lb 6.4 oz (93.6 kg)    General: Well nourished, well developed, in no acute distress Head: Atraumatic, normal size  Eyes: PEERLA, EOMI  Neck: Supple, no JVD Endocrine: No thryomegaly Cardiac: Normal S1, S2; RRR; no murmurs, rubs, or gallops Lungs: Clear to auscultation bilaterally, no wheezing, rhonchi or rales  Abd: Soft, nontender, no hepatomegaly  Ext: No edema, pulses 2+ Musculoskeletal: No deformities, BUE and BLE strength normal and equal Skin: Warm and dry, no rashes   Neuro: Alert and oriented to person, place, time, and situation, CNII-XII grossly intact, no focal deficits  Psych: Normal mood and affect   ASSESSMENT:   Chase Nunez is a 44 y.o. male who presents for the following: No diagnosis found.  PLAN:   There are no diagnoses linked to this encounter.  Disposition: No follow-ups on file.  Medication Adjustments/Labs and Tests Ordered: Current medicines are reviewed at length with the patient today.  Concerns regarding medicines are outlined above.  No orders of the defined types were placed in this encounter.  No orders of the defined types were placed in this encounter.   There are no Patient Instructions on file for this visit.   Time Spent with Patient: I have spent a total of *** minutes with patient reviewing hospital notes, telemetry, EKGs, labs and examining the patient as well as establishing an assessment and plan that was discussed with the patient.  > 50% of time was spent in direct patient  care.  Signed, Lenna Gilford. Flora Lipps, MD [facility]  [address], [address] [place], [place] [address] [phone]  11/23/2020 10:31 AM

## 2020-11-27 ENCOUNTER — Ambulatory Visit: Payer: No Typology Code available for payment source | Admitting: Cardiovascular Disease

## 2020-12-04 ENCOUNTER — Other Ambulatory Visit: Payer: No Typology Code available for payment source

## 2020-12-04 ENCOUNTER — Encounter: Payer: Self-pay | Admitting: Family Medicine

## 2020-12-04 ENCOUNTER — Other Ambulatory Visit: Payer: Self-pay | Admitting: Family Medicine

## 2020-12-04 ENCOUNTER — Telehealth (INDEPENDENT_AMBULATORY_CARE_PROVIDER_SITE_OTHER): Payer: No Typology Code available for payment source | Admitting: Family Medicine

## 2020-12-04 DIAGNOSIS — Z20822 Contact with and (suspected) exposure to covid-19: Secondary | ICD-10-CM

## 2020-12-04 DIAGNOSIS — R059 Cough, unspecified: Secondary | ICD-10-CM

## 2020-12-04 MED ORDER — AZELASTINE HCL 0.1 % NA SOLN: 2.0000 | Freq: Two times a day (BID) | NASAL | 12 refills | Status: DC

## 2020-12-04 MED ORDER — AZITHROMYCIN 250 MG PO TABS: ORAL_TABLET | ORAL | 0 refills | Status: DC

## 2020-12-04 MED FILL — AZELASTINE HCL 137 MCG/SPRA: 137 | 25 days supply | Qty: 30 | Fill #0

## 2020-12-04 MED FILL — AZITHROMYCIN 250 MG TABLET: 250 | 5 days supply | Qty: 6 | Fill #0

## 2020-12-04 MED FILL — ROSUVASTATIN CALCIUM 20 MG: 20 | 90 days supply | Qty: 90 | Fill #1

## 2020-12-04 MED FILL — ASPIRIN ADULT LOW STRENGTH: 81 | 90 days supply | Qty: 90 | Fill #1

## 2020-12-04 MED FILL — PANTOPRAZOLE SOD DR 40 MG T: 40 | 30 days supply | Qty: 30 | Fill #1

## 2020-12-04 NOTE — Progress Notes (Signed)
   Shown Dissinger is a 44 y.o. male who presents today for a virtual office visit.  Assessment/Plan:  Cough No red flags.  Patient will come here to get tested for Covid.  We will send in Astelin nasal spray.  He can continue using over-the-counter meds as needed.  Encouraged good oral hydration.  Also send in pocket prescription for azithromycin with instruction to not start unless symptoms worsen or not improve over the next couple of days.    Subjective:  HPI:  Patient with URI symptoms for the last couple of days.  Symptoms initially nasal congestion, sore throat, and ear pressure.  Has tried taking over-the-counter medication with no significant improvement.  Over the last couple days has developed more of a cough.  No wheeze.  No shortness of breath.  No fevers or chills.  No known sick contacts.  No other specific treatments tried.       Objective/Observations  Physical Exam: Gen: NAD, resting comfortably Pulm: Normal work of breathing Neuro: Grossly normal, moves all extremities Psych: Normal affect and thought content  Virtual Visit via Video   I connected with Kathreen Cosier on 12/04/20 at 11:40 AM EST by a video enabled telemedicine application and verified that I am speaking with the correct person using two identifiers. The limitations of evaluation and management by telemedicine and the availability of in person appointments were discussed. The patient expressed understanding and agreed to proceed.   Patient location: Home Provider location: [place] [employer] Persons participating in the virtual visit: Myself and Patient     Katina Degree. Jimmey Ralph, MD 12/04/2020 11:37 AM

## 2020-12-05 LAB — NOVEL CORONAVIRUS, NAA: SARS-CoV-2, NAA: NOT DETECTED

## 2020-12-05 LAB — SARS-COV-2, NAA 2 DAY TAT

## 2020-12-14 ENCOUNTER — Ambulatory Visit (INDEPENDENT_AMBULATORY_CARE_PROVIDER_SITE_OTHER): Payer: 59 | Admitting: Psychology

## 2020-12-14 DIAGNOSIS — F4323 Adjustment disorder with mixed anxiety and depressed mood: Secondary | ICD-10-CM

## 2020-12-17 NOTE — Progress Notes (Signed)
Cardiology Office Note:   Date:  12/18/2020  NAME:  Chase Nunez    MRN: [id number] DOB:  02-20-1976   PCP:  Jarold Motto, PA  Cardiologist:  No primary care provider on file.   Referring MD: Jarold Motto, PA   Chief Complaint  Patient presents with  . Coronary Artery Disease   History of Present Illness:   Chase Nunez is a 45 y.o. male with a hx of CAD, HLD who presents for follow-up. Seen 08/2020 for CP and palpitations. CCTA with mild CAD. Zio negative. Started on aspirin and statin.  He reports his symptoms of chest pain have improved with acid reflux medication.  He is on an aspirin and statin without any limitations.  No myalgias reported.  He does need a repeat lipid profile.  He is not fasting today.  Blood pressure is 123/91.  He is also worked on his anxiety and depression.  Seems to be doing well from that standpoint.  He is still exercising quite a bit.  He works as a Interior and spatial designer.  He is able to complete a full session without any chest pain, shortness of breath or palpitations.  Overall symptoms have improved with acid reflux medication as well as treatment of his anxiety.  Problem List 1. CAD  -mild non-obstructive CAD 25-49% -CAC score 134 (99th percentile) 2. HLD -T chol 202, HDL 75, LDL 110, TG 77  Past Medical History: Past Medical History:  Diagnosis Date  . Alcohol consuption of more than two drinks per day   . Anxiety   . Arthritis   . Depression   . H/O drug abuse (HCC)    none per patient since age 64  . History of chicken pox     Past Surgical History: Past Surgical History:  Procedure Laterality Date  . SHOULDER ARTHROSCOPY      Current Medications: Current Meds  Medication Sig  . aspirin EC 81 MG tablet Take 1 tablet (81 mg total) by mouth daily. Swallow whole.  Marland Kitchen azelastine (ASTELIN) 0.1 % nasal spray Place 2 sprays into both nostrils 2 (two) times daily.  . diclofenac sodium (VOLTAREN) 1 % GEL Apply 4 g topically 4 (four) times  daily. To affected joint. (Patient taking differently: Apply 4 g topically as needed. To affected joint.)  . ibuprofen (ADVIL,MOTRIN) 200 MG tablet Take 600-800 mg by mouth as needed.  . Multiple Vitamins-Minerals (MULTIVITAMIN WITH MINERALS) tablet Take 1 tablet by mouth daily.  . Omega-3 Fatty Acids (FISH OIL) 1000 MG CAPS Take by mouth.  . pantoprazole (PROTONIX) 40 MG tablet Take 1 tablet (40 mg total) by mouth daily.  . [DISCONTINUED] azithromycin (ZITHROMAX) 250 MG tablet Take 2 tabs day 1, then 1 tab daily  . [DISCONTINUED] UNABLE TO FIND Take 3 capsules by mouth at bedtime. Med Name: ZMA Tech (Vitamin B6, Magnesium and Zinc)     Allergies:    Patient has no known allergies.   Social History: Social History   Socioeconomic History  . Marital status: Married    Spouse name: Chase Nunez  . Number of children: 1  . Years of education: Not on file  . Highest education level: Not on file  Occupational History  . Not on file  Tobacco Use  . Smoking status: Former Games developer  . Smokeless tobacco: Current User    Types: Snuff  Vaping Use  . Vaping Use: Never used  Substance and Sexual Activity  . Alcohol use: Yes    Alcohol/week: 10.0 standard drinks  Types: 10 Shots of liquor per week  . Drug use: No  . Sexual activity: Yes  Other Topics Concern  . 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 in [employer]   Social Determinants of Health   Financial Resource Strain: Not on file  Food Insecurity: Not on file  Transportation Needs: Not on file  Physical Activity: Not on file  Stress: Not on file  Social Connections: Not on file    Family History: The patient's family history includes Alcohol abuse in his father; Arthritis in his father; Birth defects in his mother; COPD in his maternal grandfather and maternal grandmother; Diabetes in his paternal grandfather and paternal grandmother; Drug abuse in his father;  Heart disease in his father, maternal grandfather, paternal grandfather, and paternal grandmother; High Cholesterol in his father; Hyperlipidemia in his maternal grandfather and paternal grandfather; Hypertension in his father; Kidney disease in his paternal grandmother; Miscarriages / [place] in his mother; Thyroid disease in his mother. There is no history of Colon cancer or Prostate cancer.  ROS:   All other ROS reviewed and negative. Pertinent positives noted in the HPI.     EKGs/Labs/Other Studies Reviewed:   The following studies were personally reviewed by me today:  Zio 09/25/2020 Impression: 1. No arrhythmias detected.  2. Rare ectopy.   CCTA 09/06/2020 1. Coronary calcium score of 134. This was 99th percentile for age and sex matched control. 2.  Normal coronary origin with right dominance. 3.  Mild atherosclerosis.  CAD RADS 2. 4.  Consider other causes of non-atherosclerotic chest pain. 5.  Recommend preventive therapy and risk factor modification.  Recent Labs: 07/17/2020: ALT 20; Hemoglobin 15.3; Platelets 246; TSH 3.57 08/23/2020: BUN 16; Creatinine, Ser 1.01; Potassium 4.3; Sodium 139   Recent Lipid Panel    Component Value Date/Time   CHOL 202 (H) 07/17/2020 1456   CHOL 181 12/23/2017 1113   TRIG 77 07/17/2020 1456   HDL 75 07/17/2020 1456   HDL 58 12/23/2017 1113   CHOLHDL 2.7 07/17/2020 1456   VLDL 9.6 03/05/2019 1053   LDLCALC 110 (H) 07/17/2020 1456    Physical Exam:   VS:  BP (!) 123/91   Pulse 66   Temp 97.9 F (36.6 C)   Ht 5\' 6"  (1.676 m)   Wt 190 lb 3.2 oz (86.3 kg)   SpO2 94%   BMI 30.70 kg/m    Wt Readings from Last 3 Encounters:  12/18/20 190 lb 3.2 oz (86.3 kg)  11/01/20 191 lb 8 oz (86.9 kg)  08/23/20 194 lb (88 kg)    General: Well nourished, well developed, in no acute distress Head: Atraumatic, normal size  Eyes: PEERLA, EOMI  Neck: Supple, no JVD Endocrine: No thryomegaly Cardiac: Normal S1, S2; RRR; no murmurs, rubs, or  gallops Lungs: Clear to auscultation bilaterally, no wheezing, rhonchi or rales  Abd: Soft, nontender, no hepatomegaly  Ext: No edema, pulses 2+ Musculoskeletal: No deformities, BUE and BLE strength normal and equal Skin: Warm and dry, no rashes   Neuro: Alert and oriented to person, place, time, and situation, CNII-XII grossly intact, no focal deficits  Psych: Normal mood and affect   ASSESSMENT:   Halsey Hammen is a 45 y.o. male who presents for the following: 1. Coronary artery disease involving native coronary artery of native heart without angina pectoris   2. Agatston coronary artery calcium score between 100 and  199   3. Mixed hyperlipidemia   4. Gastroesophageal reflux disease without esophagitis     PLAN:   1. Coronary artery disease involving native coronary artery of native heart without angina pectoris 2. Agatston coronary artery calcium score between 100 and 199 3. Mixed hyperlipidemia -Noncardiac chest pain.  Related to GERD. -99th percentile coronary calcium score.  Mild 25-49% CAD. -Continue aspirin 81 mg a day. -Continue Crestor 20 mg a day.  Plan to repeat fasting lipid profile in the next few weeks.  Most recent LDL 110.  Goal less than 70.  HDL 75.  4. Gastroesophageal reflux disease without esophagitis -Continue Protonix.  Disposition: Return in about 1 year (around 12/18/2021).  Medication Adjustments/Labs and Tests Ordered: Current medicines are reviewed at length with the patient today.  Concerns regarding medicines are outlined above.  Orders Placed This Encounter  Procedures  . Lipid panel   No orders of the defined types were placed in this encounter.   Patient Instructions  Medication Instructions:  Your physician recommends that you continue on your current medications as directed. Please refer to the Current Medication list given to you today.  *If you need a refill on your cardiac medications before your next appointment, please call your  pharmacy*   Lab Work: FASTING lipid panel to check cholesterol   If you have labs (blood work) drawn today and your tests are completely normal, you will receive your results only by: Marland Kitchen MyChart Message (if you have MyChart) OR . A paper copy in the mail If you have any lab test that is abnormal or we need to change your treatment, we will call you to review the results.   Testing/Procedures: NONE   Follow-Up: At [facility] you and your health needs are our priority.  As part of our continuing mission to provide you with exceptional heart care, we have created designated Provider Care Teams.  These Care Teams include your primary Cardiologist (physician) and Advanced Practice Providers (APPs -  Physician Assistants and Nurse Practitioners) who all work together to provide you with the care you need, when you need it.  We recommend signing up for the patient portal called "MyChart".  Sign up information is provided on this After Visit Summary.  MyChart is used to connect with patients for Virtual Visits (Telemedicine).  Patients are able to view lab/test results, encounter notes, upcoming appointments, etc.  Non-urgent messages can be sent to your provider as well.   To learn more about what you can do with MyChart, go to [email or web address].    Your next appointment:   12 month(s)  The format for your next appointment:   In Person  Provider:   Dr. Flora Lipps   Other Instructions      Time Spent with Patient: I have spent a total of 25 minutes with patient reviewing hospital notes, telemetry, EKGs, labs and examining the patient as well as establishing an assessment and plan that was discussed with the patient.  > 50% of time was spent in direct patient care.  Signed, Lenna Gilford. Flora Lipps, MD [facility]  [address] [place], [place] [address] [phone]  12/18/2020 3:40 PM

## 2020-12-18 ENCOUNTER — Ambulatory Visit (INDEPENDENT_AMBULATORY_CARE_PROVIDER_SITE_OTHER): Payer: 59 | Admitting: Cardiovascular Disease

## 2020-12-18 ENCOUNTER — Other Ambulatory Visit: Payer: Self-pay

## 2020-12-18 ENCOUNTER — Encounter: Payer: Self-pay | Admitting: Cardiovascular Disease

## 2020-12-18 VITALS — BP 123/91 | HR 66 | Temp 97.9°F | Ht 66.0 in | Wt 190.2 lb

## 2020-12-18 DIAGNOSIS — K219 Gastro-esophageal reflux disease without esophagitis: Secondary | ICD-10-CM

## 2020-12-18 DIAGNOSIS — E782 Mixed hyperlipidemia: Secondary | ICD-10-CM | POA: Diagnosis not present

## 2020-12-18 DIAGNOSIS — I2510 Atherosclerotic heart disease of native coronary artery without angina pectoris: Secondary | ICD-10-CM

## 2020-12-18 DIAGNOSIS — R931 Abnormal findings on diagnostic imaging of heart and coronary circulation: Secondary | ICD-10-CM | POA: Diagnosis not present

## 2020-12-18 NOTE — Patient Instructions (Signed)
Medication Instructions:  Your physician recommends that you continue on your current medications as directed. Please refer to the Current Medication list given to you today.  *If you need a refill on your cardiac medications before your next appointment, please call your pharmacy*   Lab Work: FASTING lipid panel to check cholesterol   If you have labs (blood work) drawn today and your tests are completely normal, you will receive your results only by: Marland Kitchen MyChart Message (if you have MyChart) OR . A paper copy in the mail If you have any lab test that is abnormal or we need to change your treatment, we will call you to review the results.   Testing/Procedures: NONE   Follow-Up: At [facility], you and your health needs are our priority.  As part of our continuing mission to provide you with exceptional heart care, we have created designated Provider Care Teams.  These Care Teams include your primary Cardiologist (physician) and Advanced Practice Providers (APPs -  Physician Assistants and Nurse Practitioners) who all work together to provide you with the care you need, when you need it.  We recommend signing up for the patient portal called "MyChart".  Sign up information is provided on this After Visit Summary.  MyChart is used to connect with patients for Virtual Visits (Telemedicine).  Patients are able to view lab/test results, encounter notes, upcoming appointments, etc.  Non-urgent messages can be sent to your provider as well.   To learn more about what you can do with MyChart, go to [email or web address].    Your next appointment:   12 month(s)  The format for your next appointment:   In Person  Provider:   Dr. Flora Lipps   Other Instructions

## 2021-01-01 ENCOUNTER — Encounter: Payer: Self-pay | Admitting: Physician Assistant

## 2021-01-01 ENCOUNTER — Telehealth (INDEPENDENT_AMBULATORY_CARE_PROVIDER_SITE_OTHER): Payer: 59 | Admitting: Physician Assistant

## 2021-01-01 ENCOUNTER — Other Ambulatory Visit: Payer: 59

## 2021-01-01 VITALS — Temp 97.5°F | Ht 66.0 in | Wt 177.0 lb

## 2021-01-01 DIAGNOSIS — R197 Diarrhea, unspecified: Secondary | ICD-10-CM

## 2021-01-01 DIAGNOSIS — Z20822 Contact with and (suspected) exposure to covid-19: Secondary | ICD-10-CM | POA: Diagnosis not present

## 2021-01-01 NOTE — Progress Notes (Signed)
Virtual Visit via Video   I connected with Chase Nunez on 01/01/21 at  2:30 PM EST by a video enabled telemedicine application and verified that I am speaking with the correct person using two identifiers. Location patient: Home Location provider: [facility], Office Persons participating in the virtual visit: Justine Cossin, Jarold Motto PA-C, Corky Mull, LPN   I discussed the limitations of evaluation and management by telemedicine and the availability of in person appointments. The patient expressed understanding and agreed to proceed.  I acted as a Neurosurgeon for [employer], PA-C [employer], LPN   Subjective:   HPI:   Patient is requesting evaluation for possible COVID-19.  Symptom onset: Saturday 1/22  Travel/contacts: exposed to wife who had COVID 2 weeks ago  Vaccination status: J&J  Testing results: Tested on 1/12 with at-home test and this was negative  Patient endorses the following symptoms: Saturday night -- decreased appetite Sunday -- decreased appetite, lots of diarrhea (going at least q hour) Nasal congestion Subjective fevers/chills  Patient denies the following symptoms: Fever, HA, SOB, chest pain, cough, abdominal pain  Treatments tried: none  Patient risk factors: Current COVID-19 risk of complications score: 2 Smoking status: Chase Nunez  reports that he has quit smoking. His smokeless tobacco use includes snuff. If male, currently pregnant? []   Yes []   No  ROS: See pertinent positives and negatives per HPI.  Patient Active Problem List   Diagnosis Date Noted  . Alcohol consuption of more than two drinks per day   . Anxiety   . Depression   . Obsessive-compulsive disorder 12/17/2017  . Generalized anxiety disorder 12/17/2017    Social History   Tobacco Use  . Smoking status: Former 02/14/2018  . Smokeless tobacco: Current User    Types: Snuff  Substance Use Topics  . Alcohol use: Yes    Alcohol/week: 10.0 standard drinks     Types: 10 Shots of liquor per week    Current Outpatient Medications:  .  aspirin EC 81 MG tablet, Take 1 tablet (81 mg total) by mouth daily. Swallow whole., Disp: 90 tablet, Rfl: 3 .  azelastine (ASTELIN) 0.1 % nasal spray, Place 2 sprays into both nostrils 2 (two) times daily., Disp: 30 mL, Rfl: 12 .  diclofenac sodium (VOLTAREN) 1 % GEL, Apply 4 g topically 4 (four) times daily. To affected joint. (Patient taking differently: Apply 4 g topically as needed. To affected joint.), Disp: 100 g, Rfl: 11 .  ibuprofen (ADVIL,MOTRIN) 200 MG tablet, Take 600-800 mg by mouth as needed., Disp: , Rfl:  .  Multiple Vitamins-Minerals (MULTIVITAMIN WITH MINERALS) tablet, Take 1 tablet by mouth daily., Disp: , Rfl:  .  Omega-3 Fatty Acids (FISH OIL) 1000 MG CAPS, Take by mouth., Disp: , Rfl:  .  pantoprazole (PROTONIX) 40 MG tablet, Take 1 tablet (40 mg total) by mouth daily. (Patient taking differently: Take 40 mg by mouth as needed.), Disp: 30 tablet, Rfl: 3 .  FLUoxetine (PROZAC) 40 MG capsule, Take 2 capsules (80 mg total) by mouth daily., Disp: 180 capsule, Rfl: 3 .  rosuvastatin (CRESTOR) 20 MG tablet, Take 1 tablet (20 mg total) by mouth daily., Disp: 90 tablet, Rfl: 3  No Known Allergies  Objective:   VITALS: Per patient if applicable, see vitals. GENERAL: Alert, appears well and in no acute distress. HEENT: Atraumatic, conjunctiva clear, no obvious abnormalities on inspection of external nose and ears. NECK: Normal movements of the head and neck. CARDIOPULMONARY: No increased WOB. Speaking  in clear sentences. I:E ratio WNL.  MS: Moves all visible extremities without noticeable abnormality. PSYCH: Pleasant and cooperative, well-groomed. Speech normal rate and rhythm. Affect is appropriate. Insight and judgement are appropriate. Attention is focused, linear, and appropriate.  NEURO: CN grossly intact. Oriented as arrived to appointment on time with no prompting. Moves both UE equally.  SKIN:  No obvious lesions, wounds, erythema, or cyanosis noted on face or hands.  Assessment and Plan:   Chase Nunez was seen today for covid symptoms.  Diagnoses and all orders for this visit:  Diarrhea of presumed infectious origin   No red flags on discussion, patient is not in any obvious distress during our visit. Discussed progression of most viral illnesses, and recommended supportive care at this point in time.  Discussed drinking diluted gatorade and plain water to help prevent dehydration.  Work on adding in bland diet such as crackers, bananas, etc, and advance as tolerated.  May trial imodium up to 8 mg/day.  Discussed over the counter supportive care options, including Tylenol 500 mg q 8 hours, with recommendations to push fluids and rest.  Recommended COVID-19 testing which he is agreeable to, this will be scheduled this afternoon for patient.  If lightheadedness, dizziness, pre-syncope, he was recommended to go to urgent care or ER.  Reviewed return precautions including new/worsening fever, SOB, new/worsening cough, sudden onset changes of symptoms. Recommended need to self-quarantine and practice social distancing until symptoms resolve. I recommend that patient follow-up if symptoms worsen or persist despite treatment x 7-10 days, sooner if needed.  I discussed the assessment and treatment plan with the patient. The patient was provided an opportunity to ask questions and all were answered. The patient agreed with the plan and demonstrated an understanding of the instructions.   The patient was advised to call back or seek an in-person evaluation if the symptoms worsen or if the condition fails to improve as anticipated.   CMA or LPN served as scribe during this visit. History, Physical, and Plan performed by medical provider. The above documentation has been reviewed and is accurate and complete.  Nunez, Chase 01/01/2021

## 2021-01-02 LAB — NOVEL CORONAVIRUS, NAA: SARS-CoV-2, NAA: DETECTED — AB

## 2021-01-02 LAB — SARS-COV-2, NAA 2 DAY TAT

## 2021-01-10 DIAGNOSIS — E782 Mixed hyperlipidemia: Secondary | ICD-10-CM | POA: Diagnosis not present

## 2021-01-10 DIAGNOSIS — I2510 Atherosclerotic heart disease of native coronary artery without angina pectoris: Secondary | ICD-10-CM | POA: Diagnosis not present

## 2021-01-10 DIAGNOSIS — R931 Abnormal findings on diagnostic imaging of heart and coronary circulation: Secondary | ICD-10-CM | POA: Diagnosis not present

## 2021-01-10 LAB — LIPID PANEL
Chol/HDL Ratio: 1.9 ratio (ref 0.0–5.0)
Cholesterol, Total: 130 mg/dL (ref 100–199)
HDL: 69 mg/dL (ref >39)
LDL Chol Calc (NIH): 48 mg/dL (ref 0–99)
Triglycerides: 58 mg/dL (ref 0–149)
VLDL Cholesterol Cal: 13 mg/dL (ref 5–40)

## 2021-01-22 MED FILL — FLUoxetine HCL 40 MG CAPS: 40 | 90 days supply | Qty: 180 | Fill #2

## 2021-01-24 ENCOUNTER — Ambulatory Visit (INDEPENDENT_AMBULATORY_CARE_PROVIDER_SITE_OTHER): Payer: 59 | Admitting: Psychology

## 2021-01-24 DIAGNOSIS — F4323 Adjustment disorder with mixed anxiety and depressed mood: Secondary | ICD-10-CM

## 2021-02-02 ENCOUNTER — Other Ambulatory Visit: Payer: Self-pay

## 2021-02-02 ENCOUNTER — Ambulatory Visit (INDEPENDENT_AMBULATORY_CARE_PROVIDER_SITE_OTHER): Payer: 59

## 2021-02-02 ENCOUNTER — Encounter: Payer: Self-pay | Admitting: Physician Assistant

## 2021-02-02 ENCOUNTER — Ambulatory Visit: Payer: 59 | Admitting: Physician Assistant

## 2021-02-02 VITALS — BP 140/90 | HR 58 | Temp 98.4°F | Ht 66.0 in | Wt 190.5 lb

## 2021-02-02 DIAGNOSIS — M79642 Pain in left hand: Secondary | ICD-10-CM | POA: Diagnosis not present

## 2021-02-02 DIAGNOSIS — M79641 Pain in right hand: Secondary | ICD-10-CM

## 2021-02-02 DIAGNOSIS — K219 Gastro-esophageal reflux disease without esophagitis: Secondary | ICD-10-CM | POA: Diagnosis not present

## 2021-02-02 LAB — COMPREHENSIVE METABOLIC PANEL
ALT: 31 U/L (ref 0–53)
AST: 35 U/L (ref 0–37)
Albumin: 4.4 g/dL (ref 3.5–5.2)
Alkaline Phosphatase: 77 U/L (ref 39–117)
BUN: 16 mg/dL (ref 6–23)
CO2: 27 mEq/L (ref 19–32)
Calcium: 9.3 mg/dL (ref 8.4–10.5)
Chloride: 104 mEq/L (ref 96–112)
Creatinine, Ser: 0.96 mg/dL (ref 0.40–1.50)
GFR: 97.4 mL/min (ref >60.00)
Glucose, Bld: 96 mg/dL (ref 70–99)
Potassium: 4 mEq/L (ref 3.5–5.1)
Sodium: 136 mEq/L (ref 135–145)
Total Bilirubin: 0.7 mg/dL (ref 0.2–1.2)
Total Protein: 7.1 g/dL (ref 6.0–8.3)

## 2021-02-02 LAB — CBC WITH DIFFERENTIAL/PLATELET
Basophils Absolute: 0 10*3/uL (ref 0.0–0.1)
Basophils Relative: 1 % (ref 0.0–3.0)
Eosinophils Absolute: 0.1 10*3/uL (ref 0.0–0.7)
Eosinophils Relative: 1.5 % (ref 0.0–5.0)
HCT: 43.5 % (ref 39.0–52.0)
Hemoglobin: 15 g/dL (ref 13.0–17.0)
Lymphocytes Relative: 32.7 % (ref 12.0–46.0)
Lymphs Abs: 1.4 10*3/uL (ref 0.7–4.0)
MCHC: 34.4 g/dL (ref 30.0–36.0)
MCV: 89.8 fl (ref 78.0–100.0)
Monocytes Absolute: 0.5 10*3/uL (ref 0.1–1.0)
Monocytes Relative: 10.3 % (ref 3.0–12.0)
Neutro Abs: 2.4 10*3/uL (ref 1.4–7.7)
Neutrophils Relative %: 54.5 % (ref 43.0–77.0)
Platelets: 184 10*3/uL (ref 150.0–400.0)
RBC: 4.84 Mil/uL (ref 4.22–5.81)
RDW: 13.4 % (ref 11.5–15.5)
WBC: 4.4 10*3/uL (ref 4.0–10.5)

## 2021-02-02 LAB — SEDIMENTATION RATE: Sed Rate: 5 mm/hr (ref 0–15)

## 2021-02-02 LAB — C-REACTIVE PROTEIN: CRP: <1 mg/dL (ref 0.5–20.0)

## 2021-02-02 NOTE — Patient Instructions (Signed)
It was great to see you!  May take the protonix for your stomach as needed, take for 3-5 days at a time. If taking more often than not, take everyday.  I will be in touch with lab and xray results.  Trial topical voltaren for your fingers.  Take care,  Jarold Motto PA-C

## 2021-02-02 NOTE — Progress Notes (Signed)
Chase Nunez is a 45 y.o. male here for a new problem.  I acted as a Neurosurgeon for [employer], PA-C Chase Mull, LPN   History of Present Illness:   Chief Complaint  Patient presents with  . Joint Swelling    HPI   Joint swelling Pt c/o joint swelling bilateral hands/fingers past few weeks right > left. Pt has taken some Motrin with some relief. Does not take consistently because of stomach upset. Does jujutsu regularly and therefore does a lot of regular repetitive gripping and pulling with bilateral hands. Has some tenderness to finger joints. Has taped his R third and fourth fingers together during activity over the past few months but this has not made any significant change. He denies any joint pain elsewhere in his body, unusual rashes lesions. Denies family history of autoimmune disorders to his knowledge.  GERD Stopped taking his Protonix because he felt like it was really helping. But is getting occasional nausea and wondering if he can take this medication as needed. Denies rectal bleeding, vomiting, diarrhea.  Past Medical History:  Diagnosis Date  . Alcohol consuption of more than two drinks per day   . Anxiety   . Arthritis   . Depression   . H/O drug abuse (HCC)    none per patient since age 20  . History of chicken pox      Social History   Tobacco Use  . Smoking status: Former Games developer  . Smokeless tobacco: Current User    Types: Snuff  Vaping Use  . Vaping Use: Never used  Substance Use Topics  . Alcohol use: Yes    Alcohol/week: 10.0 standard drinks    Types: 10 Shots of liquor per week  . Drug use: No    Past Surgical History:  Procedure Laterality Date  . SHOULDER ARTHROSCOPY      Family History  Problem Relation Age of Onset  . Thyroid disease Mother   . Birth defects Mother   . Miscarriages / [place] Mother   . Heart disease Father   . Hypertension Father   . Alcohol abuse Father   . Arthritis Father   . Drug abuse Father   .  High Cholesterol Father   . Heart disease Maternal Grandfather   . Hyperlipidemia Maternal Grandfather   . COPD Maternal Grandfather   . Diabetes Paternal Grandmother   . Heart disease Paternal Grandmother   . Kidney disease Paternal Grandmother   . Heart disease Paternal Grandfather   . Diabetes Paternal Grandfather   . Hyperlipidemia Paternal Grandfather   . COPD Maternal Grandmother   . Colon cancer Neg Hx   . Prostate cancer Neg Hx     No Known Allergies  Current Medications:   Current Outpatient Medications:  .  aspirin EC 81 MG tablet, Take 1 tablet (81 mg total) by mouth daily. Swallow whole., Disp: 90 tablet, Rfl: 3 .  diclofenac sodium (VOLTAREN) 1 % GEL, Apply 4 g topically 4 (four) times daily. To affected joint. (Patient taking differently: Apply 4 g topically as needed. To affected joint.), Disp: 100 g, Rfl: 11 .  ibuprofen (ADVIL,MOTRIN) 200 MG tablet, Take 600-800 mg by mouth as needed., Disp: , Rfl:  .  Multiple Vitamins-Minerals (MULTIVITAMIN WITH MINERALS) tablet, Take 1 tablet by mouth daily., Disp: , Rfl:  .  Omega-3 Fatty Acids (FISH OIL) 1000 MG CAPS, Take by mouth., Disp: , Rfl:  .  pantoprazole (PROTONIX) 40 MG tablet, Take 1 tablet (40 mg total)  by mouth daily. (Patient taking differently: Take 40 mg by mouth as needed.), Disp: 30 tablet, Rfl: 3 .  FLUoxetine (PROZAC) 40 MG capsule, Take 2 capsules (80 mg total) by mouth daily., Disp: 180 capsule, Rfl: 3 .  rosuvastatin (CRESTOR) 20 MG tablet, Take 1 tablet (20 mg total) by mouth daily., Disp: 90 tablet, Rfl: 3   Review of Systems:   ROS  Negative unless otherwise specified per HPI.'  Vitals:   Vitals:   02/02/21 1142  BP: 140/90  Pulse: (!) 58  Temp: 98.4 F (36.9 C)  TempSrc: Temporal  SpO2: 96%  Weight: 190 lb 8 oz (86.4 kg)  Height: 5\' 6"  (1.676 m)     Body mass index is 30.75 kg/m.  Physical Exam:   Physical Exam Vitals and nursing note reviewed.  Constitutional:      Appearance: He  is well-developed and well-nourished.  HENT:     Head: Normocephalic.  Eyes:     Extraocular Movements: EOM normal.     Conjunctiva/sclera: Conjunctivae normal.     Pupils: Pupils are equal, round, and reactive to light.  Pulmonary:     Effort: Pulmonary effort is normal.  Musculoskeletal:        General: Normal range of motion.     Cervical back: Normal range of motion.     Comments: Right hand Swelling to fourth finger PIP without significant redness Small fluctuant area without tenderness to base of third finger nail fold Normal range of motion  Skin:    General: Skin is warm and dry.  Neurological:     Mental Status: He is alert and oriented to person, place, and time.  Psychiatric:        Mood and Affect: Mood and affect normal.        Behavior: Behavior normal.        Thought Content: Thought content normal.        Judgment: Judgment normal.     Results for orders placed or performed in visit on 01/01/21  Novel Coronavirus, NAA ([facility])   Specimen: Nasopharyngeal(NP) swabs in vial transport medium   Nasopharynge  Result Value Ref Range   SARS-CoV-2, NAA Detected (A) Not Detected  SARS-COV-2, NAA 2 DAY TAT   Nasopharynge  Result Value Ref Range   SARS-CoV-2, NAA 2 DAY TAT Performed     Assessment and Plan:   Chase Nunez was seen today for joint swelling.  Diagnoses and all orders for this visit:  Bilateral hand pain Suspect symptoms are due to overuse. Will complete bilateral hand x-rays per patient request and also do basic autoimmune work-up. I recommended trialing topical Voltaren gel twice daily to see if that improves his symptoms. May ice and use compression if indicated. Low threshold to send to sports medicine or rheumatology if symptoms worsen or any red flags on his imaging or labs. -     DG Hand Complete Left; Future -     DG Hand Complete Right; Future -     CBC with Differential/Platelet -     Comprehensive metabolic panel -     Sedimentation rate -      C-reactive protein -     ANA -     Rheumatoid factor  Gastroesophageal reflux disease, unspecified whether esophagitis present Overall controlled. I recommended that if he is going to start Protonix that he takes consistently for at least a few days to see if this helps his symptoms. Continue other antireflex precautions. Also recommended that if  he finds taking this is more beneficial than not then to take consistently every day.  CMA or LPN served as scribe during this visit. History, Physical, and Plan performed by medical provider. The above documentation has been reviewed and is accurate and complete.   Jarold Motto, PA-C

## 2021-02-06 LAB — RHEUMATOID FACTOR: Rheumatoid fact SerPl-aCnc: <14 IU/mL (ref <14)

## 2021-02-06 LAB — ANA: Anti Nuclear Antibody (ANA): NEGATIVE

## 2021-03-01 MED FILL — ROSUVASTATIN CALCIUM 20 MG: 20 | 90 days supply | Qty: 90 | Fill #2

## 2021-03-02 ENCOUNTER — Other Ambulatory Visit (HOSPITAL_BASED_OUTPATIENT_CLINIC_OR_DEPARTMENT_OTHER): Payer: Self-pay

## 2021-03-07 ENCOUNTER — Ambulatory Visit (INDEPENDENT_AMBULATORY_CARE_PROVIDER_SITE_OTHER): Payer: 59 | Admitting: Psychology

## 2021-03-07 DIAGNOSIS — F4323 Adjustment disorder with mixed anxiety and depressed mood: Secondary | ICD-10-CM

## 2021-03-12 ENCOUNTER — Other Ambulatory Visit (HOSPITAL_COMMUNITY): Payer: Self-pay

## 2021-03-12 MED FILL — Aspirin Tab Delayed Release 81 MG: ORAL | 90 days supply | Qty: 90 | Fill #0 | Status: AC

## 2021-03-21 ENCOUNTER — Other Ambulatory Visit (HOSPITAL_COMMUNITY): Payer: Self-pay

## 2021-03-22 ENCOUNTER — Other Ambulatory Visit (HOSPITAL_COMMUNITY): Payer: Self-pay

## 2021-04-11 ENCOUNTER — Ambulatory Visit (INDEPENDENT_AMBULATORY_CARE_PROVIDER_SITE_OTHER): Payer: 59 | Admitting: Psychology

## 2021-04-11 DIAGNOSIS — F4323 Adjustment disorder with mixed anxiety and depressed mood: Secondary | ICD-10-CM

## 2021-04-23 ENCOUNTER — Other Ambulatory Visit (HOSPITAL_COMMUNITY): Payer: Self-pay

## 2021-04-23 MED FILL — Fluoxetine HCl Cap 40 MG: ORAL | 90 days supply | Qty: 180 | Fill #0 | Status: AC

## 2021-04-30 ENCOUNTER — Ambulatory Visit: Payer: 59 | Admitting: Family

## 2021-04-30 ENCOUNTER — Other Ambulatory Visit: Payer: Self-pay

## 2021-04-30 ENCOUNTER — Encounter: Payer: Self-pay | Admitting: Family

## 2021-04-30 VITALS — BP 146/90 | HR 64 | Temp 98.5°F | Ht 66.0 in | Wt 200.0 lb

## 2021-04-30 DIAGNOSIS — M7130 Other bursal cyst, unspecified site: Secondary | ICD-10-CM

## 2021-04-30 NOTE — Patient Instructions (Signed)
Incision and Drainage, Care After This sheet gives you information about how to care for yourself after your procedure. Your health care provider may also give you more specific instructions. If you have problems or questions, contact your health care provider. What can I expect after the procedure? After the procedure, it is common to have:  Pain or discomfort around the incision site.  Blood, fluid, or pus (drainage) from the incision.  Redness and firm skin around the incision site. Follow these instructions at home: Medicines  Take over-the-counter and prescription medicines only as told by your health care provider.  If you were prescribed an antibiotic medicine, use or take it as told by your health care provider. Do not stop using the antibiotic even if you start to feel better. Wound care Follow instructions from your health care provider about how to take care of your wound. Make sure you:  Wash your hands with soap and water before and after you change your bandage (dressing). If soap and water are not available, use hand sanitizer.  Change your dressing and packing as told by your health care provider. ? If your dressing is dry or stuck when you try to remove it, moisten or wet the dressing with saline or water so that it can be removed without harming your skin or tissues. ? If your wound is packed, leave it in place until your health care provider tells you to remove it. To remove the packing, moisten or wet the packing with saline or water so that it can be removed without harming your skin or tissues.  Leave stitches (sutures), skin glue, or adhesive strips in place. These skin closures may need to stay in place for 2 weeks or longer. If adhesive strip edges start to loosen and curl up, you may trim the loose edges. Do not remove adhesive strips completely unless your health care provider tells you to do that. Check your wound every day for signs of infection. Check  for:  More redness, swelling, or pain.  More fluid or blood.  Warmth.  Pus or a bad smell. If you were sent home with a drain tube in place, follow instructions from your health care provider about:  How to empty it.  How to care for it at home.   General instructions  Rest the affected area.  Do not take baths, swim, or use a hot tub until your health care provider approves. Ask your health care provider if you may take showers. You may only be allowed to take sponge baths.  Return to your normal activities as told by your health care provider. Ask your health care provider what activities are safe for you. Your health care provider may put you on activity or lifting restrictions.  The incision will continue to drain. It is normal to have some clear or slightly bloody drainage. The amount of drainage should lessen each day.  Do not apply any creams, ointments, or liquids unless you have been told to by your health care provider.  Keep all follow-up visits as told by your health care provider. This is important. Contact a health care provider if:  Your cyst or abscess returns.  You have a fever or chills.  You have more redness, swelling, or pain around your incision.  You have more fluid or blood coming from your incision.  Your incision feels warm to the touch.  You have pus or a bad smell coming from your incision.  You have red   streaks above or below the incision site. Get help right away if:  You have severe pain or bleeding.  You cannot eat or drink without vomiting.  You have decreased urine output.  You become short of breath.  You have chest pain.  You cough up blood.  The affected area becomes numb or starts to tingle. These symptoms may represent a serious problem that is an emergency. Do not wait to see if the symptoms will go away. Get medical help right away. Call your local emergency services (911 in the [place] Do not drive yourself to the  hospital. Summary  After this procedure, it is common to have fluid, blood, or pus coming from the surgery site.  Follow all home care instructions. You will be told how to take care of your incision, how to check for infection, and how to take medicines.  If you were prescribed an antibiotic medicine, take it as told by your health care provider. Do not stop taking the antibiotic even if you start to feel better.  Contact a health care provider if you have increased redness, swelling, or pain around your incision. Get help right away if you have chest pain, you vomit, you cough up blood, or you have shortness of breath.  Keep all follow-up visits as told by your health care provider. This is important. This information is not intended to replace advice given to you by your health care provider. Make sure you discuss any questions you have with your health care provider. Document Revised: 10/26/2018 Document Reviewed: 10/26/2018 Elsevier Patient Education  2021 [employer]  

## 2021-04-30 NOTE — Progress Notes (Signed)
Acute Office Visit  Subjective:    Patient ID: Chase Nunez, male    DOB: September 10, 1976, 45 y.o.   MRN: [id number]  Chief Complaint  Patient presents with  . finger infection    Pt c/o right middle finger ,lump below nail bed and finger nail is flattening out.    HPI Patient is in today with c/o a bump on his right middle finger x several months. The size of it comes and goes. It is mildly tender. He became more concerned as it is flattening his nailbed. No redness  Past Medical History:  Diagnosis Date  . Alcohol consuption of more than two drinks per day   . Anxiety   . Arthritis   . Depression   . H/O drug abuse (HCC)    none per patient since age 70  . History of chicken pox     Past Surgical History:  Procedure Laterality Date  . SHOULDER ARTHROSCOPY      Family History  Problem Relation Age of Onset  . Thyroid disease Mother   . Birth defects Mother   . Miscarriages / [place] Mother   . Heart disease Father   . Hypertension Father   . Alcohol abuse Father   . Arthritis Father   . Drug abuse Father   . High Cholesterol Father   . Heart disease Maternal Grandfather   . Hyperlipidemia Maternal Grandfather   . COPD Maternal Grandfather   . Diabetes Paternal Grandmother   . Heart disease Paternal Grandmother   . Kidney disease Paternal Grandmother   . Heart disease Paternal Grandfather   . Diabetes Paternal Grandfather   . Hyperlipidemia Paternal Grandfather   . COPD Maternal Grandmother   . Colon cancer Neg Hx   . Prostate cancer Neg Hx     Social History   Socioeconomic History  . Marital status: Married    Spouse name: Archie Patten  . Number of children: 1  . Years of education: Not on file  . Highest education level: Not on file  Occupational History  . Not on file  Tobacco Use  . Smoking status: Former Games developer  . Smokeless tobacco: Current User    Types: Snuff  Vaping Use  . Vaping Use: Never used  Substance and Sexual Activity  . Alcohol use:  Yes    Alcohol/week: 10.0 standard drinks    Types: 10 Shots of liquor per week  . Drug use: No  . Sexual activity: Yes  Other Topics Concern  . 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 aspirin 81 MG EC tablet TAKE 1 TABLET BY MOUTH ONCE DAILY (SWALLOW WHOLE) 90 tablet 3  . Azelastine HCl 137 MCG/SPRAY SOLN INSTILL 2 SPRAYS INTO BOTH NOSTRILS TWICE DAILY 30 mL 12  . diclofenac sodium (VOLTAREN) 1 % GEL Apply 4 g topically 4 (four) times daily. To affected joint. (Patient taking differently: Apply 4 g topically as needed. To affected joint.) 100 g 11  . FLUoxetine (PROZAC) 40 MG capsule TAKE  2 CAPSULES BY MOUTH ONCE A DAY 180 capsule 3  . ibuprofen (ADVIL,MOTRIN) 200 MG tablet Take 600-800 mg by mouth as needed.    . Multiple Vitamins-Minerals (MULTIVITAMIN WITH MINERALS) tablet Take 1 tablet by mouth daily.    . Omega-3 Fatty Acids (FISH OIL) 1000 MG CAPS Take by mouth.    . pantoprazole (PROTONIX) 40 MG tablet TAKE 1 TABLET BY MOUTH ONCE DAILY (Patient taking differently: Take 40 mg by mouth as needed.) 30 tablet 3  . rosuvastatin (CRESTOR) 20 MG tablet TAKE 1 TABLET BY MOUTH ONCE DAILY 90 tablet 3  . aspirin EC 81 MG tablet Take 1 tablet (81 mg total) by mouth daily. Swallow whole. 90 tablet 3   No facility-administered medications prior to visit.    No Known Allergies  Review of Systems  Constitutional: Negative.   Respiratory: Negative.   Cardiovascular: Negative.   Skin:       Bump on the distal right middle finger  Psychiatric/Behavioral: Negative.    All other systems reviewed and are negative.      Objective:    Physical Exam Vitals and nursing note reviewed.  Cardiovascular:     Rate and Rhythm: Normal rate and regular rhythm.     Pulses: Normal pulses.     Heart sounds: Normal heart sounds.  Musculoskeletal:     Comments: Right middle finger: Informed consent was obtained. Under sterile technique, .5cc of Lidocaine used to anesthetize the right middle finger. Small incision made to expel a small amount of clear thick, jelly-like drainage. Cleansed and bandaged by the nurse      BP (!) 146/90 (BP Location: Left Arm, Patient Position: Sitting, Cuff Size: Large)   Pulse 64   Temp 98.5 F (36.9 C) (Temporal)   Ht 5\' 6"  (1.676 m)   Wt 200 lb (90.7 kg)   SpO2 96%   BMI 32.28 kg/m  Wt Readings from Last 3 Encounters:  04/30/21 200 lb (90.7 kg)  02/02/21 190 lb 8 oz (86.4 kg)  01/01/21 177 lb (80.3 kg)    Health Maintenance Due  Topic Date Due  . Hepatitis C Screening  Never done  . COVID-19 Vaccine (2 - Booster for Janssen series) 06/28/2020    There are no preventive care reminders to display for this patient.   Lab Results  Component Value Date   TSH 3.57 07/17/2020   Lab Results  Component Value Date   WBC 4.4 02/02/2021   HGB 15.0 02/02/2021   HCT 43.5 02/02/2021   MCV 89.8 02/02/2021   PLT 184.0 02/02/2021   Lab Results  Component Value Date   NA 136 02/02/2021   K 4.0 02/02/2021   CO2 27 02/02/2021   GLUCOSE 96 02/02/2021   BUN 16 02/02/2021   CREATININE 0.96 02/02/2021   BILITOT 0.7 02/02/2021   ALKPHOS 77 02/02/2021   AST 35 02/02/2021   ALT 31 02/02/2021   PROT 7.1 02/02/2021   ALBUMIN 4.4 02/02/2021   CALCIUM 9.3 02/02/2021   GFR 97.40 02/02/2021   Lab Results  Component Value Date   CHOL 130 01/10/2021   Lab Results  Component Value Date   HDL 69 01/10/2021   Lab Results  Component Value Date   LDLCALC 48 01/10/2021   Lab Results  Component Value Date   TRIG 58  01/10/2021   Lab Results  Component Value Date   CHOLHDL 1.9 01/10/2021   No results found for: HGBA1C     Assessment & Plan:  Problem List Items Addressed This Visit   None   Visit Diagnoses    Myxoid cyst    -  Primary       No orders of the defined types were placed in this encounter.    Eulis Foster, FNP

## 2021-05-18 ENCOUNTER — Ambulatory Visit (INDEPENDENT_AMBULATORY_CARE_PROVIDER_SITE_OTHER): Payer: 59 | Admitting: Family Medicine

## 2021-05-18 ENCOUNTER — Other Ambulatory Visit: Payer: Self-pay

## 2021-05-18 ENCOUNTER — Encounter: Payer: Self-pay | Admitting: Family Medicine

## 2021-05-18 VITALS — BP 142/76 | HR 63 | Temp 98.3°F | Ht 66.0 in | Wt 198.4 lb

## 2021-05-18 DIAGNOSIS — F411 Generalized anxiety disorder: Secondary | ICD-10-CM

## 2021-05-18 DIAGNOSIS — Z0001 Encounter for general adult medical examination with abnormal findings: Secondary | ICD-10-CM

## 2021-05-18 DIAGNOSIS — F341 Dysthymic disorder: Secondary | ICD-10-CM

## 2021-05-18 NOTE — Assessment & Plan Note (Signed)
Stable on Prozac 40 mg daily. 

## 2021-05-18 NOTE — Progress Notes (Signed)
Chief Complaint:  Chase Nunez is a 45 y.o. male who presents today for his annual comprehensive physical exam.    Assessment/Plan:  New/Acute Problems: Elevated BP reading Slightly above goal today.  Previously well controlled.  Continue home monitoring goal 140/90 or lower.  Chronic Problems Addressed Today: Depression Stable on Prozac 40 mg daily.  Generalized anxiety disorder Stable on Prozac 40 mg daily.  Preventative Healthcare: UTD on vaccines and screenings.   Patient Counseling(The following topics were reviewed and/or handout was given):  -Nutrition: Stressed importance of moderation in sodium/caffeine intake, saturated fat and cholesterol, caloric balance, sufficient intake of fresh fruits, vegetables, and fiber.  -Stressed the importance of regular exercise.   -Substance Abuse: Discussed cessation/primary prevention of tobacco, alcohol, or other drug use; driving or other dangerous activities under the influence; availability of treatment for abuse.   -Injury prevention: Discussed safety belts, safety helmets, smoke detector, smoking near bedding or upholstery.   -Sexuality: Discussed sexually transmitted diseases, partner selection, use of condoms, avoidance of unintended pregnancy and contraceptive alternatives.   -Dental health: Discussed importance of regular tooth brushing, flossing, and dental visits.  -Health maintenance and immunizations reviewed. Please refer to Health maintenance section.  Return to care in 1 year for next preventative visit.     Subjective:  HPI:  He has no acute complaints today.   Lifestyle Diet: None specific.  Exercise: Very busy with work.   Depression screen [facility] 2/9 05/18/2021  Decreased Interest 0  Down, Depressed, Hopeless 0  PHQ - 2 Score 0  Altered sleeping 1  Tired, decreased energy 0  Change in appetite 1  Feeling bad or failure about yourself  0  Trouble concentrating 0  Moving slowly or fidgety/restless 0   Suicidal thoughts 0  PHQ-9 Score 2  Difficult doing work/chores Not difficult at all    Health Maintenance Due  Topic Date Due   Hepatitis C Screening  Never done     ROS: Per HPI, otherwise a complete review of systems was negative.   PMH:  The following were reviewed and entered/updated in epic: Past Medical History:  Diagnosis Date   Alcohol consuption of more than two drinks per day    Anxiety    Arthritis    Depression    H/O drug abuse (HCC)    none per patient since age 24   History of chicken pox    Patient Active Problem List   Diagnosis Date Noted   Alcohol consuption of more than two drinks per day    Anxiety    Depression    Obsessive-compulsive disorder 12/17/2017   Generalized anxiety disorder 12/17/2017   Past Surgical History:  Procedure Laterality Date   SHOULDER ARTHROSCOPY      Family History  Problem Relation Age of Onset   Thyroid disease Mother    Birth defects Mother    Miscarriages / Stillbirths Mother    Heart disease Father    Hypertension Father    Alcohol abuse Father    Arthritis Father    Drug abuse Father    High Cholesterol Father    Heart disease Maternal Grandfather    Hyperlipidemia Maternal Grandfather    COPD Maternal Grandfather    Diabetes Paternal Grandmother    Heart disease Paternal Grandmother    Kidney disease Paternal Grandmother    Heart disease Paternal Grandfather    Diabetes Paternal Grandfather    Hyperlipidemia Paternal Grandfather    COPD Maternal Grandmother    Colon  cancer Neg Hx    Prostate cancer Neg Hx     Medications- reviewed and updated Current Outpatient Medications  Medication Sig Dispense Refill   aspirin 81 MG EC tablet TAKE 1 TABLET BY MOUTH ONCE DAILY (SWALLOW WHOLE) 90 tablet 3   diclofenac sodium (VOLTAREN) 1 % GEL Apply 4 g topically 4 (four) times daily. To affected joint. (Patient taking differently: Apply 4 g topically as needed. To affected joint.) 100 g 11   FLUoxetine  (PROZAC) 40 MG capsule TAKE 2 CAPSULES BY MOUTH ONCE A DAY 180 capsule 3   ibuprofen (ADVIL,MOTRIN) 200 MG tablet Take 600-800 mg by mouth as needed.     Multiple Vitamins-Minerals (MULTIVITAMIN WITH MINERALS) tablet Take 1 tablet by mouth daily.     Omega-3 Fatty Acids (FISH OIL) 1000 MG CAPS Take by mouth.     pantoprazole (PROTONIX) 40 MG tablet TAKE 1 TABLET BY MOUTH ONCE DAILY (Patient taking differently: Take 40 mg by mouth as needed.) 30 tablet 3   rosuvastatin (CRESTOR) 20 MG tablet TAKE 1 TABLET BY MOUTH ONCE DAILY 90 tablet 3   No current facility-administered medications for this visit.    Allergies-reviewed and updated No Known Allergies  Social History   Socioeconomic History   Marital status: Married    Spouse name: Tonya   Number of children: 1   Years of education: Not on file   Highest education level: Not on file  Occupational History   Not on file  Tobacco Use   Smoking status: Former    Pack years: 0.00   Smokeless tobacco: Current    Types: Snuff  Vaping Use   Vaping Use: Never used  Substance and Sexual Activity   Alcohol use: Yes    Alcohol/week: 10.0 standard drinks    Types: 10 Shots of liquor per week   Drug use: No   Sexual activity: Yes  Other Topics Concern   Not on file  Social History Narrative   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 Wt Readings from Last 3 Encounters:  05/18/21 198 lb 6.4 oz (90 kg)  04/30/21 200 lb (90.7 kg)  02/02/21 190 lb 8 oz  (86.4 kg)   Gen: NAD, resting comfortably HEENT: TMs normal bilaterally. OP clear. No thyromegaly noted.  CV: RRR with no murmurs appreciated Pulm: NWOB, CTAB with no crackles, wheezes, or rhonchi GI: Normal bowel sounds present. Soft, Nontender, Nondistended. MSK: no edema, cyanosis, or clubbing noted Skin: warm, dry Neuro: CN2-12 grossly intact. Strength 5/5 in upper and lower extremities. Reflexes symmetric and intact bilaterally.  Psych: Normal affect and thought content     Verdie Wilms M. 02/04/21, MD 05/18/2021 2:36 PM

## 2021-05-18 NOTE — Patient Instructions (Signed)
It was very nice to see you today!  Keep up the good work!  We'll see you back in a year for your next physical. Come back to see [place] sooner if needed.   Take care, Dr Jimmey Ralph  PLEASE NOTE:  If you had any lab tests please let us know if you have not heard back within a few days. You may see your results on mychart before we have a chance to review them but we will give you a call once they are reviewed by [place]. If we ordered any referrals today, please let us know if you have not heard from their office within the next week.   Please try these tips to maintain a healthy lifestyle:  Eat at least 3 REAL meals and 1-2 snacks per day.  Aim for no more than 5 hours between eating.  If you eat breakfast, please do so within one hour of getting up.   Each meal should contain half fruits/vegetables, one quarter protein, and one quarter carbs (no bigger than a computer mouse)  Cut down on sweet beverages. This includes juice, soda, and sweet tea.   Drink at least 1 glass of water with each meal and aim for at least 8 glasses per day  Exercise at least 150 minutes every week.   Preventive Care 67-2 Years Old, Male Preventive care refers to lifestyle choices and visits with your health care provider that can promote health and wellness. This includes: A yearly physical exam. This is also called an annual wellness visit. Regular dental and eye exams. Immunizations. Screening for certain conditions. Healthy lifestyle choices, such as: Eating a healthy diet. Getting regular exercise. Not using drugs or products that contain nicotine and tobacco. Limiting alcohol use. What can I expect for my preventive care visit? Physical exam Your health care provider will check your: Height and weight. These may be used to calculate your BMI (body mass index). BMI is a measurement that tells if you are at a healthy weight. Heart rate and blood pressure. Body temperature. Skin for abnormal  spots. Counseling Your health care provider may ask you questions about your: Past medical problems. Family's medical history. Alcohol, tobacco, and drug use. Emotional well-being. Home life and relationship well-being. Sexual activity. Diet, exercise, and sleep habits. Work and work Astronomer. Access to firearms. What immunizations do I need?  Vaccines are usually given at various ages, according to a schedule. Your health care provider will recommend vaccines for you based on your age, medicalhistory, and lifestyle or other factors, such as travel or where you work. What tests do I need? Blood tests Lipid and cholesterol levels. These may be checked every 5 years, or more often if you are over 39 years old. Hepatitis C test. Hepatitis B test. Screening Lung cancer screening. You may have this screening every year starting at age 4 if you have a 30-pack-year history of smoking and currently smoke or have quit within the past 15 years. Prostate cancer screening. Recommendations will vary depending on your family history and other risks. Genital exam to check for testicular cancer or hernias. Colorectal cancer screening. All adults should have this screening starting at age 49 and continuing until age 59. Your health care provider may recommend screening at age 43 if you are at increased risk. You will have tests every 1-10 years, depending on your results and the type of screening test. Diabetes screening. This is done by checking your blood sugar (glucose) after you have not  eaten for a while (fasting). You may have this done every 1-3 years. STD (sexually transmitted disease) testing, if you are at risk. Follow these instructions at home: Eating and drinking  Eat a diet that includes fresh fruits and vegetables, whole grains, lean protein, and low-fat dairy products. Take vitamin and mineral supplements as recommended by your health care provider. Do not drink alcohol if  your health care provider tells you not to drink. If you drink alcohol: Limit how much you have to 0-2 drinks a day. Be aware of how much alcohol is in your drink. In the [place] one drink equals one 12 oz bottle of beer (355 mL), one 5 oz glass of wine (148 mL), or one 1 oz glass of hard liquor (44 mL).  Lifestyle Take daily care of your teeth and gums. Brush your teeth every morning and night with fluoride toothpaste. Floss one time each day. Stay active. Exercise for at least 30 minutes 5 or more days each week. Do not use any products that contain nicotine or tobacco, such as cigarettes, e-cigarettes, and chewing tobacco. If you need help quitting, ask your health care provider. Do not use drugs. If you are sexually active, practice safe sex. Use a condom or other form of protection to prevent STIs (sexually transmitted infections). If told by your health care provider, take low-dose aspirin daily starting at age 22. Find healthy ways to cope with stress, such as: Meditation, yoga, or listening to music. Journaling. Talking to a trusted person. Spending time with friends and family. Safety Always wear your seat belt while driving or riding in a vehicle. Do not drive: If you have been drinking alcohol. Do not ride with someone who has been drinking. When you are tired or distracted. While texting. Wear a helmet and other protective equipment during sports activities. If you have firearms in your house, make sure you follow all gun safety procedures. What's next? Go to your health care provider once a year for an annual wellness visit. Ask your health care provider how often you should have your eyes and teeth checked. Stay up to date on all vaccines. This information is not intended to replace advice given to you by your health care provider. Make sure you discuss any questions you have with your healthcare provider. Document Revised: 08/24/2019 Document Reviewed: 11/19/2018 Elsevier  Patient Education  2022 [employer].

## 2021-05-24 ENCOUNTER — Other Ambulatory Visit (HOSPITAL_COMMUNITY): Payer: Self-pay

## 2021-05-24 ENCOUNTER — Other Ambulatory Visit: Payer: Self-pay | Admitting: Cardiovascular Disease

## 2021-05-24 MED ORDER — PANTOPRAZOLE SODIUM 40 MG PO TBEC
DELAYED_RELEASE_TABLET | Freq: Every day | ORAL | 3 refills | Status: AC
  Filled 2021-05-24: qty 30, 30d supply, fill #0
  Filled 2021-11-15: qty 30, 30d supply, fill #1
  Filled 2022-02-02: qty 30, 30d supply, fill #2

## 2021-06-06 ENCOUNTER — Other Ambulatory Visit (HOSPITAL_COMMUNITY): Payer: Self-pay

## 2021-06-06 MED FILL — Rosuvastatin Calcium Tab 20 MG: ORAL | 90 days supply | Qty: 90 | Fill #0 | Status: AC

## 2021-06-25 ENCOUNTER — Other Ambulatory Visit (HOSPITAL_COMMUNITY): Payer: Self-pay

## 2021-06-25 MED FILL — Aspirin Tab Delayed Release 81 MG: ORAL | 90 days supply | Qty: 90 | Fill #1 | Status: AC

## 2021-07-23 ENCOUNTER — Other Ambulatory Visit (HOSPITAL_COMMUNITY): Payer: Self-pay

## 2021-07-23 ENCOUNTER — Other Ambulatory Visit: Payer: Self-pay | Admitting: Physician Assistant

## 2021-07-23 MED ORDER — FLUOXETINE HCL 40 MG PO CAPS
ORAL_CAPSULE | Freq: Every day | ORAL | 3 refills | Status: AC
  Filled 2021-07-23: qty 180, 90d supply, fill #0
  Filled 2021-10-22: qty 180, 90d supply, fill #1
  Filled 2022-01-17: qty 180, 90d supply, fill #2
  Filled 2022-07-08: qty 180, 90d supply, fill #3

## 2021-07-25 ENCOUNTER — Ambulatory Visit (HOSPITAL_COMMUNITY): Payer: 59 | Admitting: Clinical

## 2021-08-06 ENCOUNTER — Other Ambulatory Visit: Payer: Self-pay

## 2021-08-06 ENCOUNTER — Ambulatory Visit (HOSPITAL_COMMUNITY): Payer: 59 | Admitting: Licensed Clinical Social Worker

## 2021-08-06 NOTE — Progress Notes (Unsigned)
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; wife nurse at [employer] long; feellike should go back and make more money Bad about spending money Hard to leave things undone; somewhat trouble with OCD (improved)  2015 1/2 bottle night (moved to [employer]) 1 wk  Acid/mushrooms; good bit; 45 y/o couple tiems month 2 years  Hx rx pills highschool; stopped 1998; rx for suergeries 1997 December 2015 started keeping bottle Adderall one time Tobaco: hx dip now can every few daysimprove communication Chase Nunez retired from Loss adjuster, chartered; few mon thnths last seen; started 2020 (early covid-got out of hand, evertyghing feels impersonal) Recent stressors: new with parents; grew up in good house; parents still married; don't do family get togethers b/c seeing people getting old is difficult Drank a lot since 2015; wanting to do better Get along with wife; sometimes fighting vs roommates Married 26 years Only child went everywhere with dad growing up; not don't want to be around; hes always been athletic mom always almost overweight; dad making comments toward mom and repeats himself all the time 37 y/o son big gap; spending time with dad to avoiuld guilt Son: we're just 2 different people; son athletic, screwed up first year of college, spent lots of money; don't reach out b/c get mad when doesn't response  Appetite: changes with drinking Teacher: independent TEFL teacher,  get home late and drink to sleep; Drinking bingies curing covida Always irritabile; loss of interest: always on the go not sit around in bed (frustrates wife) Last SI years ago; passive  Family: raised by both parents (dad worked in [employer], Clinical cytogeneticist intermittent); only child, often bored; didn't like to hang out with mom; did sports with dad Trouble in [place]; outpatient therapy;rx pills and mushrooms No tx as adult for SUD; When son born wife said get it together (47 y/o)  Work: longest  45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

## 2021-08-20 ENCOUNTER — Other Ambulatory Visit: Payer: Self-pay

## 2021-08-20 ENCOUNTER — Ambulatory Visit (INDEPENDENT_AMBULATORY_CARE_PROVIDER_SITE_OTHER): Payer: 59 | Admitting: Licensed Clinical Social Worker

## 2021-08-20 DIAGNOSIS — F1010 Alcohol abuse, uncomplicated: Secondary | ICD-10-CM

## 2021-08-20 DIAGNOSIS — F411 Generalized anxiety disorder: Secondary | ICD-10-CM | POA: Diagnosis not present

## 2021-08-20 NOTE — Progress Notes (Signed)
   THERAPIST PROGRESS NOTE  Session Time: 11am-1145am  Participation Level: Active  Behavioral Response: CasualAlertAnxious  Type of Therapy: Individual Therapy  Treatment Goals addressed: Anxiety, Coping, and Diagnosis: alcohol use  Interventions: CBT, Motivational Interviewing, and Supportive  Summary: Chase Nunez is a 45 y.o. male who presents with anxiety and alcohol abuse. Client reported stable relationship with wife and 2 episodes of problematic drinking. Client was able to identify triggers for drinking. Client was receptive to psycho-education provided by clinician and created measurable goal for skills.   Suicidal/Homicidal: Nowithout intent/plan  Therapist Response: Clinician checked in with client, assessed for SI/HI/psychosis and overall level of functioning. Client did not show progress toward goal AEB 2 episodes of drinking liquor outside of the home and continued drinking almost daily. Clinician provided psycho-education related to dopamine and addiction and discussed client comment on instant gratification and adrenaline seeking hobbies. Clinician and client reviewed self assessment, focusing on parts of life and addressing unmet needs by creating SMART goals.  Plan: Return again in 2 weeks.  Diagnosis: Axis I: Alcohol Abuse and Generalized Anxiety Disorder       Harlon Ditty, LCSW 08/20/2021

## 2021-08-30 ENCOUNTER — Other Ambulatory Visit (HOSPITAL_COMMUNITY): Payer: Self-pay

## 2021-08-30 ENCOUNTER — Other Ambulatory Visit: Payer: Self-pay | Admitting: Cardiovascular Disease

## 2021-08-30 MED ORDER — ROSUVASTATIN CALCIUM 20 MG PO TABS
ORAL_TABLET | Freq: Every day | ORAL | 3 refills | Status: AC
  Filled 2021-08-30: qty 90, 90d supply, fill #0
  Filled 2021-11-29: qty 90, 90d supply, fill #1

## 2021-09-03 ENCOUNTER — Ambulatory Visit (HOSPITAL_COMMUNITY): Payer: 59 | Admitting: Licensed Clinical Social Worker

## 2021-09-04 ENCOUNTER — Other Ambulatory Visit: Payer: Self-pay

## 2021-09-04 ENCOUNTER — Ambulatory Visit (INDEPENDENT_AMBULATORY_CARE_PROVIDER_SITE_OTHER): Payer: 59 | Admitting: Licensed Clinical Social Worker

## 2021-09-04 DIAGNOSIS — F1010 Alcohol abuse, uncomplicated: Secondary | ICD-10-CM | POA: Diagnosis not present

## 2021-09-04 DIAGNOSIS — F411 Generalized anxiety disorder: Secondary | ICD-10-CM | POA: Diagnosis not present

## 2021-09-04 NOTE — Progress Notes (Signed)
   THERAPIST PROGRESS NOTE  Session Time: 09-1049  Participation Level: Active  Behavioral Response: CasualAlertAnxious  Type of Therapy: Individual Therapy  Treatment Goals addressed: Coping and Diagnosis: alcohol abuse  Interventions: CBT, Motivational Interviewing, and Supportive  Summary: Chase Nunez is a 45 y.o. male who presents with alcohol use disorder. Client updated recent stressors and unhealthy responses to stressors.   Suicidal/Homicidal: Nowithout intent/plan  Therapist Response: Clinician checked in with client, assessed for SI/HI/psychosis and substance use. Clinician reviewed with client responses to stages of change and effect of recent behaviors and substance use responses. Clinician presented CBT triangle and connection of thoughts feelings and behaviors. Clinician and client practiced in session utilization of I-statements in discussions and walking away when elevated to complete the stress cycle in a healthy way without fighting or drinking. Client was receptive to communication skills and identifying specific thoughts, feelings, and related body sensations.  Plan: Return again in 2 weeks.  Diagnosis: Axis I: Alcohol Abuse        Harlon Ditty, LCSW 09/04/2021

## 2021-09-05 DIAGNOSIS — M546 Pain in thoracic spine: Secondary | ICD-10-CM | POA: Diagnosis not present

## 2021-09-05 DIAGNOSIS — M9901 Segmental and somatic dysfunction of cervical region: Secondary | ICD-10-CM | POA: Diagnosis not present

## 2021-09-05 DIAGNOSIS — M9902 Segmental and somatic dysfunction of thoracic region: Secondary | ICD-10-CM | POA: Diagnosis not present

## 2021-09-05 DIAGNOSIS — M25619 Stiffness of unspecified shoulder, not elsewhere classified: Secondary | ICD-10-CM | POA: Diagnosis not present

## 2021-09-05 DIAGNOSIS — M7918 Myalgia, other site: Secondary | ICD-10-CM | POA: Diagnosis not present

## 2021-09-12 DIAGNOSIS — M9901 Segmental and somatic dysfunction of cervical region: Secondary | ICD-10-CM | POA: Diagnosis not present

## 2021-09-12 DIAGNOSIS — M7918 Myalgia, other site: Secondary | ICD-10-CM | POA: Diagnosis not present

## 2021-09-12 DIAGNOSIS — M546 Pain in thoracic spine: Secondary | ICD-10-CM | POA: Diagnosis not present

## 2021-09-12 DIAGNOSIS — M9902 Segmental and somatic dysfunction of thoracic region: Secondary | ICD-10-CM | POA: Diagnosis not present

## 2021-09-12 DIAGNOSIS — M25619 Stiffness of unspecified shoulder, not elsewhere classified: Secondary | ICD-10-CM | POA: Diagnosis not present

## 2021-09-17 ENCOUNTER — Other Ambulatory Visit: Payer: Self-pay

## 2021-09-17 ENCOUNTER — Ambulatory Visit (INDEPENDENT_AMBULATORY_CARE_PROVIDER_SITE_OTHER): Payer: 59 | Admitting: Licensed Clinical Social Worker

## 2021-09-17 DIAGNOSIS — M546 Pain in thoracic spine: Secondary | ICD-10-CM | POA: Diagnosis not present

## 2021-09-17 DIAGNOSIS — M9902 Segmental and somatic dysfunction of thoracic region: Secondary | ICD-10-CM | POA: Diagnosis not present

## 2021-09-17 DIAGNOSIS — F411 Generalized anxiety disorder: Secondary | ICD-10-CM

## 2021-09-17 DIAGNOSIS — F1010 Alcohol abuse, uncomplicated: Secondary | ICD-10-CM

## 2021-09-17 DIAGNOSIS — M9901 Segmental and somatic dysfunction of cervical region: Secondary | ICD-10-CM | POA: Diagnosis not present

## 2021-09-17 DIAGNOSIS — M25619 Stiffness of unspecified shoulder, not elsewhere classified: Secondary | ICD-10-CM | POA: Diagnosis not present

## 2021-09-17 DIAGNOSIS — M7918 Myalgia, other site: Secondary | ICD-10-CM | POA: Diagnosis not present

## 2021-09-18 NOTE — Progress Notes (Signed)
   THERAPIST PROGRESS NOTE  Session Time: 10-1149  Participation Level: Active  Behavioral Response: CasualAlertAnxious  Type of Therapy: Individual Therapy  Treatment Goals addressed: Coping and Diagnosis: decrease alcohol use by half. Update: no consumption of alcohol through the end of October 2022.  Interventions: CBT, Motivational Interviewing, and Supportive  Summary: Chase Nunez is a 45 y.o. male who presents with generalized anxiety and alcohol use disorder. Client reports still drinking almost daily. Client was receptive to psycho-education and in agreement with goal of no alcohol through the end of the month with a week goal of getting whiskey out of the home.   Suicidal/Homicidal: Nowithout intent/plan  Therapist Response: Clinician checked in, assessed for SI/HI/psychosis and overall level of functioning including substance use. Clinician discussed with client behavior traits of in contemplation stage of change and challenged client to identify and challenge thoughts giving self permission to break personally set boundaries. Client provided client with addictive personalities packet for review prior to next session.  Plan: Return again in 2 weeks.  Diagnosis: Axis I: Alcohol Abuse and Generalized Anxiety Disorder        Harlon Ditty, LCSW 09/17/2021

## 2021-09-24 DIAGNOSIS — M9902 Segmental and somatic dysfunction of thoracic region: Secondary | ICD-10-CM | POA: Diagnosis not present

## 2021-09-24 DIAGNOSIS — M7918 Myalgia, other site: Secondary | ICD-10-CM | POA: Diagnosis not present

## 2021-09-24 DIAGNOSIS — M25619 Stiffness of unspecified shoulder, not elsewhere classified: Secondary | ICD-10-CM | POA: Diagnosis not present

## 2021-09-24 DIAGNOSIS — M546 Pain in thoracic spine: Secondary | ICD-10-CM | POA: Diagnosis not present

## 2021-09-24 DIAGNOSIS — M9901 Segmental and somatic dysfunction of cervical region: Secondary | ICD-10-CM | POA: Diagnosis not present

## 2021-10-01 ENCOUNTER — Ambulatory Visit (INDEPENDENT_AMBULATORY_CARE_PROVIDER_SITE_OTHER): Payer: 59 | Admitting: Licensed Clinical Social Worker

## 2021-10-01 ENCOUNTER — Other Ambulatory Visit: Payer: Self-pay

## 2021-10-01 DIAGNOSIS — F429 Obsessive-compulsive disorder, unspecified: Secondary | ICD-10-CM

## 2021-10-01 DIAGNOSIS — F411 Generalized anxiety disorder: Secondary | ICD-10-CM

## 2021-10-01 DIAGNOSIS — M9901 Segmental and somatic dysfunction of cervical region: Secondary | ICD-10-CM | POA: Diagnosis not present

## 2021-10-01 DIAGNOSIS — F1010 Alcohol abuse, uncomplicated: Secondary | ICD-10-CM | POA: Diagnosis not present

## 2021-10-01 DIAGNOSIS — M25619 Stiffness of unspecified shoulder, not elsewhere classified: Secondary | ICD-10-CM | POA: Diagnosis not present

## 2021-10-01 DIAGNOSIS — M9902 Segmental and somatic dysfunction of thoracic region: Secondary | ICD-10-CM | POA: Diagnosis not present

## 2021-10-01 DIAGNOSIS — M7918 Myalgia, other site: Secondary | ICD-10-CM | POA: Diagnosis not present

## 2021-10-01 DIAGNOSIS — M546 Pain in thoracic spine: Secondary | ICD-10-CM | POA: Diagnosis not present

## 2021-10-02 NOTE — Progress Notes (Signed)
   THERAPIST PROGRESS NOTE  Session Time: 10-1144  Participation Level: Active  Behavioral Response: CasualAlertAnxious  Type of Therapy: Individual Therapy  Treatment Goals addressed: Anxiety, Coping, and Diagnosis: alcohol abuse  Interventions: CBT, Motivational Interviewing, and Supportive  Summary: Killian Schwer is a 45 y.o. male who presents with alcohol use disorder, increased symptoms of anxiety. Client is in agreement to check in with PCP related to increased intensity in OCD sx while not drinking. Client processed benefits of harm reduction vs abstinence. Client was able to identify goals for reduced alcohol consumption over the next few weeks at is on target to meet goal of 3 weeks no alcohol use by the weekend. Client identified that he and his wife could easily give each other excuses/permissions to drink which he was mindful of in the past week. Client was receptive of psycho-education and feels comfortable with goals for controlled drinking. Client was receptive to coping skills for anxiety and will attempt at work over the next 2 weeks.   Suicidal/Homicidal: No  Therapist Response: Clinician checked in assessed for SI/HI/psychosis and overall level of functioning including alcohol use and intensity of anxiety symptoms. Clinician encouraged client to check in with PCP about increased sx due to the effect of alcohol on the system and currently not drinking for several weeks. Clinician and client reviewed addictive personality scales and discussed use of SMART goals for drinking and training. Clinician provided client psycho-education on common reasons in relapse in early recovery and common challenges in early recovery.  Plan: Return again in 2 weeks.  Diagnosis: Axis I: Alcohol Abuse and Anxiety Disorder NOS        Harlon Ditty, LCSW 10/01/2021

## 2021-10-08 DIAGNOSIS — M25619 Stiffness of unspecified shoulder, not elsewhere classified: Secondary | ICD-10-CM | POA: Diagnosis not present

## 2021-10-08 DIAGNOSIS — M9901 Segmental and somatic dysfunction of cervical region: Secondary | ICD-10-CM | POA: Diagnosis not present

## 2021-10-08 DIAGNOSIS — M7918 Myalgia, other site: Secondary | ICD-10-CM | POA: Diagnosis not present

## 2021-10-08 DIAGNOSIS — M546 Pain in thoracic spine: Secondary | ICD-10-CM | POA: Diagnosis not present

## 2021-10-08 DIAGNOSIS — M9902 Segmental and somatic dysfunction of thoracic region: Secondary | ICD-10-CM | POA: Diagnosis not present

## 2021-10-15 ENCOUNTER — Other Ambulatory Visit: Payer: Self-pay

## 2021-10-15 ENCOUNTER — Ambulatory Visit (HOSPITAL_COMMUNITY): Payer: 59 | Admitting: Licensed Clinical Social Worker

## 2021-10-15 DIAGNOSIS — M7918 Myalgia, other site: Secondary | ICD-10-CM | POA: Diagnosis not present

## 2021-10-15 DIAGNOSIS — M546 Pain in thoracic spine: Secondary | ICD-10-CM | POA: Diagnosis not present

## 2021-10-15 DIAGNOSIS — M9901 Segmental and somatic dysfunction of cervical region: Secondary | ICD-10-CM | POA: Diagnosis not present

## 2021-10-15 DIAGNOSIS — M9902 Segmental and somatic dysfunction of thoracic region: Secondary | ICD-10-CM | POA: Diagnosis not present

## 2021-10-15 DIAGNOSIS — M25619 Stiffness of unspecified shoulder, not elsewhere classified: Secondary | ICD-10-CM | POA: Diagnosis not present

## 2021-10-22 ENCOUNTER — Other Ambulatory Visit (HOSPITAL_COMMUNITY): Payer: Self-pay

## 2021-10-22 DIAGNOSIS — M546 Pain in thoracic spine: Secondary | ICD-10-CM | POA: Diagnosis not present

## 2021-10-22 DIAGNOSIS — M9902 Segmental and somatic dysfunction of thoracic region: Secondary | ICD-10-CM | POA: Diagnosis not present

## 2021-10-22 DIAGNOSIS — M25619 Stiffness of unspecified shoulder, not elsewhere classified: Secondary | ICD-10-CM | POA: Diagnosis not present

## 2021-10-22 DIAGNOSIS — M7918 Myalgia, other site: Secondary | ICD-10-CM | POA: Diagnosis not present

## 2021-10-22 DIAGNOSIS — M9901 Segmental and somatic dysfunction of cervical region: Secondary | ICD-10-CM | POA: Diagnosis not present

## 2021-10-29 ENCOUNTER — Ambulatory Visit (HOSPITAL_COMMUNITY): Payer: 59 | Admitting: Licensed Clinical Social Worker

## 2021-10-29 ENCOUNTER — Other Ambulatory Visit: Payer: Self-pay

## 2021-10-29 DIAGNOSIS — F411 Generalized anxiety disorder: Secondary | ICD-10-CM

## 2021-10-29 DIAGNOSIS — F429 Obsessive-compulsive disorder, unspecified: Secondary | ICD-10-CM

## 2021-10-29 DIAGNOSIS — F1010 Alcohol abuse, uncomplicated: Secondary | ICD-10-CM

## 2021-10-30 NOTE — Progress Notes (Signed)
   THERAPIST PROGRESS NOTE  Session Time: 09-1044  Participation Level: Active  Behavioral Response: CasualAlertEuthymic  Type of Therapy: Individual Therapy  Treatment Goals addressed: Coping and Diagnosis: Decrease alcohol use to no more than 2 drinks per sitting no more than 3 days per week.  Interventions: CBT, Motivational Interviewing, Solution Focused, and Supportive  Summary: Chase Nunez is a 45 y.o. male who presents with alcohol abuse, anxiety. Client needs continued support AEB drinking in excess on the weekends in addition to weekly goal. Client identified the feeling of boredom as a trigger for drinking and worked to create alternative behaviors/hobbies in place of when he would usually get a drink. Client shared how his wife's drinking impacts his pattern of use.  Suicidal/Homicidal: No  Therapist Response: Clinician checked in with client, assessed for SI/HI/psychosis and overall level of functioning. Clinician problem solved with client alternative goal to include managing weekend drinking. New goal for the following 2 weeks: no more than 2 drinks per day, no more than 4 days in a 7 day week. Clinician processed with client thoughts/feelings related to growing older and becoming less independent and/or dependent on others. Client has additional goal of sitting with self without distraction for 10 minutes prior to next session.  Plan: Return again in 2 weeks.  Diagnosis: Axis I: Alcohol Abuse      Harlon Ditty, LCSW 10/30/2021

## 2021-11-05 DIAGNOSIS — M546 Pain in thoracic spine: Secondary | ICD-10-CM | POA: Diagnosis not present

## 2021-11-05 DIAGNOSIS — M9902 Segmental and somatic dysfunction of thoracic region: Secondary | ICD-10-CM | POA: Diagnosis not present

## 2021-11-05 DIAGNOSIS — M25619 Stiffness of unspecified shoulder, not elsewhere classified: Secondary | ICD-10-CM | POA: Diagnosis not present

## 2021-11-05 DIAGNOSIS — M9901 Segmental and somatic dysfunction of cervical region: Secondary | ICD-10-CM | POA: Diagnosis not present

## 2021-11-05 DIAGNOSIS — M7918 Myalgia, other site: Secondary | ICD-10-CM | POA: Diagnosis not present

## 2021-11-12 ENCOUNTER — Ambulatory Visit (HOSPITAL_COMMUNITY): Payer: 59 | Admitting: Licensed Clinical Social Worker

## 2021-11-16 ENCOUNTER — Other Ambulatory Visit (HOSPITAL_COMMUNITY): Payer: Self-pay

## 2021-11-19 ENCOUNTER — Ambulatory Visit: Payer: 59 | Admitting: Physician Assistant

## 2021-11-19 ENCOUNTER — Ambulatory Visit (INDEPENDENT_AMBULATORY_CARE_PROVIDER_SITE_OTHER): Payer: 59 | Admitting: Licensed Clinical Social Worker

## 2021-11-19 ENCOUNTER — Other Ambulatory Visit: Payer: Self-pay

## 2021-11-19 ENCOUNTER — Encounter: Payer: Self-pay | Admitting: Physician Assistant

## 2021-11-19 ENCOUNTER — Ambulatory Visit (INDEPENDENT_AMBULATORY_CARE_PROVIDER_SITE_OTHER)
Admission: RE | Admit: 2021-11-19 | Discharge: 2021-11-19 | Disposition: A | Discharge status: Home / Self Care | Payer: 59 | Source: Ambulatory Visit | Attending: Physician Assistant | Admitting: Physician Assistant

## 2021-11-19 VITALS — BP 124/90 | HR 68 | Temp 98.2°F | Ht 66.0 in | Wt 205.4 lb

## 2021-11-19 DIAGNOSIS — F1010 Alcohol abuse, uncomplicated: Secondary | ICD-10-CM

## 2021-11-19 DIAGNOSIS — S99922A Unspecified injury of left foot, initial encounter: Secondary | ICD-10-CM | POA: Diagnosis not present

## 2021-11-19 DIAGNOSIS — F411 Generalized anxiety disorder: Secondary | ICD-10-CM | POA: Diagnosis not present

## 2021-11-19 DIAGNOSIS — M7742 Metatarsalgia, left foot: Secondary | ICD-10-CM

## 2021-11-19 DIAGNOSIS — F429 Obsessive-compulsive disorder, unspecified: Secondary | ICD-10-CM | POA: Diagnosis not present

## 2021-11-19 NOTE — Patient Instructions (Addendum)
It was great to see you!  An order for an xray has been put in for you. To get your xray, you can walk in at the [facility] location without a scheduled appointment.  The address is [address] It is across the street from [facility]. X-ray is located in the basement.  Hours of operation are M-F 8:30am to 5:00pm. Please note that they are closed for lunch between 12:30 and 1:00pm.    Try to buy "metatarsal pad" for your foot. Something similar to this:       May use topical voltaren on your foot as well.    Take care,  Jarold Motto PA-C

## 2021-11-19 NOTE — Progress Notes (Signed)
   THERAPIST PROGRESS NOTE  Session Time: 1005-1050  Participation Level: Active  Behavioral Response: CasualAlertAnxious  Type of Therapy: Individual Therapy  Treatment Goals addressed: Coping and Diagnosis: decrease alcohol use to no more than 2 drinks per sitting no more than 4 days per 7 day week.  Interventions: CBT and Motivational Interviewing  Summary: Chase Nunez is a 45 y.o. male who presents with alcohol use disorder. Client reported no progress toward recent goal change AEB drinking to excess multiple days. Client was able to identify some thoughts/feelings related to behavior of increased drinking. Client discussed how wife's drinking effects his drinking patterns. Client was receptive to information on Codependency. Client was receptive and practiced phrases in session for "fighting fair" with asking thought/feeling to wife rather than mind reading. Client reported self and wife had goal of no alcohol use until next session.  Suicidal/Homicidal: No  Therapist Response: Clinician checked in with client, assessed for SI/HI/psychosis and overall level of functioning. Clinician inquired about progress toward goal and processed barriers to meeting goal. Clinician provided client with education on co-dependency and effects in multiple areas of life. Clinician practiced in session with client responding when self or partner is elevated by asking questions before making assumptions, as client noted is helpful in session to pause before responding. Clinician encouraged client to focus on personal needs to support recovery, not only meeting partners needs.  Plan: Return again in 2 weeks.  Diagnosis: Axis I: Alcohol Abuse     Harlon Ditty, LCSW 11/19/2021
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
## 2021-11-21 ENCOUNTER — Encounter: Payer: Self-pay | Admitting: Physician Assistant

## 2021-11-21 DIAGNOSIS — M7742 Metatarsalgia, left foot: Secondary | ICD-10-CM

## 2021-11-26 DIAGNOSIS — M7918 Myalgia, other site: Secondary | ICD-10-CM | POA: Diagnosis not present

## 2021-11-26 DIAGNOSIS — M9901 Segmental and somatic dysfunction of cervical region: Secondary | ICD-10-CM | POA: Diagnosis not present

## 2021-11-26 DIAGNOSIS — M9902 Segmental and somatic dysfunction of thoracic region: Secondary | ICD-10-CM | POA: Diagnosis not present

## 2021-11-26 DIAGNOSIS — M546 Pain in thoracic spine: Secondary | ICD-10-CM | POA: Diagnosis not present

## 2021-11-26 DIAGNOSIS — M25619 Stiffness of unspecified shoulder, not elsewhere classified: Secondary | ICD-10-CM | POA: Diagnosis not present

## 2021-11-29 ENCOUNTER — Other Ambulatory Visit (HOSPITAL_COMMUNITY): Payer: Self-pay

## 2021-12-05 ENCOUNTER — Ambulatory Visit: Payer: 59 | Admitting: Physical Therapy

## 2021-12-05 ENCOUNTER — Encounter: Payer: Self-pay | Admitting: Physical Therapy

## 2021-12-05 ENCOUNTER — Other Ambulatory Visit: Payer: Self-pay

## 2021-12-05 DIAGNOSIS — M79672 Pain in left foot: Secondary | ICD-10-CM

## 2021-12-05 DIAGNOSIS — M6281 Muscle weakness (generalized): Secondary | ICD-10-CM

## 2021-12-05 DIAGNOSIS — R262 Difficulty in walking, not elsewhere classified: Secondary | ICD-10-CM | POA: Diagnosis not present
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
## 2021-12-05 NOTE — Therapy (Signed)
OUTPATIENT PHYSICAL THERAPY TREATMENT NOTE   Patient Name: Chase Nunez MRN: [id number] DOB:11/22/1976, 45 y.o., male Today's Date: 12/12/2021  PCP: Jarold Motto, PA REFERRING PROVIDER: Jarold Motto, PA   PT End of Session - 12/12/21 1426     Visit Number 2    Number of Visits 12    Date for PT Re-Evaluation 01/16/22    Authorization Type [place] [employer]    Progress Note Due on Visit 10    PT Start Time 1430    PT Stop Time 1510    PT Time Calculation (min) 40 min             Past Medical History:  Diagnosis Date   Alcohol consuption of more than two drinks per day    Anxiety    Arthritis    Depression    H/O drug abuse (HCC)    none per patient since age 27   History of chicken pox    Past Surgical History:  Procedure Laterality Date   SHOULDER ARTHROSCOPY     Patient Active Problem List   Diagnosis Date Noted   Alcohol consuption of more than two drinks per day    Anxiety    Depression    Obsessive-compulsive disorder 12/17/2017   Generalized anxiety disorder 12/17/2017    REFERRING DIAG: M77.42 (ICD-10-CM) - Metatarsalgia of left foo  THERAPY DIAG:  Difficulty in walking, not elsewhere classified  Muscle weakness (generalized)  Pain in left foot  PERTINENT HISTORY: previous left medial ankle pain  PRECAUTIONS: none  SUBJECTIVE: States he has been doing his exercises, symptoms are about the same  PAIN:  Are you having pain? Yes VAS scale: 10/10 Pain location: bottom of foot Pain orientation: Left  PAIN TYPE: sharp Pain description: intermittent  Aggravating factors: weightbearing Relieving factors: rest     OBJECTIVE:   DIAGNOSTIC FINDINGS: 11/19/21 xray  EXAM: LEFT FOOT - COMPLETE 3+ VIEW   FINDINGS: There is no evidence of fracture or dislocation. There is no evidence of arthropathy or other focal bone abnormality. Soft tissues are unremarkable.   IMPRESSION: Negative.      COGNITION:  Overall cognitive status:  Within functional limits for tasks assessed     SENSATION:  Light touch: Appears intact   PALPATION: No tenderness to palpation along lower leg, foot or between toes, overall WNL joint mobility noted with metatarsals, hallux rigidus WNL Bilaterally  LE AROM/PROM:  A/PROM Right 12/05/2021 Left 12/05/2021  Hip flexion    Hip extension    Hip abduction    Hip adduction    Hip internal rotation    Hip external rotation    Knee flexion    Knee extension    Ankle Dorsiflexion knee straight 12 10  Ankle Plantarflexion knee straight 45 50  Ankle dorsiflexion knee bent 18 15  Ankle inversion 45 45  Ankle eversion 5 5   (Blank rows = not tested)  LE MMT:  MMT Right 12/05/2021 Left 12/05/2021  Hip flexion    Hip extension    Hip abduction    Hip adduction    Hip internal rotation    Hip external rotation    Knee flexion 5 5  Knee extension 5 5  Ankle dorsiflexion 5 5  Ankle plantarflexion 5 5*  Ankle inversion 5 5  Ankle eversion 5 5   (Blank rows = not tested)  *painful with single leg heel raises    GAIT: Distance walked: reduced hallux extension and increased supination noted on  left compared to right Assistive device utilized: None Level of assistance: Complete Independence Comments: NA    TODAY'S TREATMENT: 12/12/21  Therapeutic Exercise:  Aerobic: Supine:  Seated:  Standing:SLS on floor x2 30" holds B, SLS on blue foam x3 Bilat 30" holds - visual and tactile cues tape of left second toe with anchor for plantar plate rupture. Forwards reaches to target 4x5 B  Neuromuscular Re-education: Manual Therapy: Therapeutic Activity: Self Care: Trigger Point Dry Needling:  Modalities:     PATIENT EDUCATION:  Education details: on taping methods, rationale for exercises and importance of reducing stress on plantar surface for 2-3 weeks to allow it to reduce inflammation Person educated: Patient Education method: Explanation, Demonstration, and  Handouts Education comprehension: verbalized understanding    HOME EXERCISE PROGRAM: [id number]  ASSESSMENT:  CLINICAL IMPRESSION: Patient is a 45 y.o. male who was seen today for physical therapy evaluation and treatment for left foot/toe pain. Patient presents with symptoms consistent plantar plate injury of the left second toe. Objective impairments include Abnormal gait, decreased activity tolerance, difficulty walking, decreased ROM, decreased strength, and pain. These impairments are limiting patient from occupation and recreational activities (pickle ball, running). Personal factors including Fitness are also affecting patient's functional outcome. Patient will benefit from skilled PT to address above impairments and improve overall function.  12/12/21 Patient tolerated taping method well. Educated patient on different products/taping methods to reduce stress on plantar surface of foot. Educated patient on suspected pathology and plan moving forward. Discussed reducing stress on plantar surface of foot for 2-3 weeks to reduce acute inflammation and pain. Patient in agreement. Will continue with current POC as tolerated.   REHAB POTENTIAL: Good  CLINICAL DECISION MAKING: Stable/uncomplicated  EVALUATION COMPLEXITY: Low   GOALS: Goals reviewed with patient? Yes  SHORT TERM GOALS:  STG Name Target Date Goal status  1 Patient will be independent in self management strategies to improve quality of life and functional outcomes. Baseline:  12/26/2021  INITIAL  2 Patient will report at least 50% improvement in overall symptoms and/or function to demonstrate improved functional mobility Baseline: 0% 12/26/2021 INITIAL  3 Patient will be able to report no pain along toe with walking  Baseline: current pain 12/26/2021 INITIAL  4  Baseline:    5  Baseline:    6  Baseline:    7  Baseline:     LONG TERM GOALS:   LTG Name Target Date Goal status  1 Patient will report at least 75%  improvement in overall symptoms and/or function to demonstrate improved functional mobility Baseline:0% 01/16/2022  INITIAL  2 Patient will be able to demonstrate improved foot intrinsics by demonstrating isolated left hallux extension Baseline: 01/16/2022 INITIAL  3 Patient will be able to jump rope with shoes on and without pain to return to PLOF Baseline:unable 01/16/2022 INITIAL  4  Baseline:    5  Baseline:    6  Baseline:    7  Baseline:       PLAN: PT FREQUENCY: 2x/week  PT DURATION: 6 weeks  PLANNED INTERVENTIONS: Therapeutic exercises, Therapeutic activity, Neuro Muscular re-education, Balance training, Gait training, Patient/Family education, Joint mobilization, Dry Needling, Spinal mobilization, Cryotherapy, Moist heat, and Manual therapy  PLAN FOR NEXT SESSION: foot intrinsic mobility and strengthening (foot flat), balance for foot intrinsic, PF stretches, tpe taping   3:30 PM, 12/12/21 Tereasa Coop, DPT Physical Therapy with [facility]  [phone] office

## 2021-12-12 ENCOUNTER — Encounter: Payer: Self-pay | Admitting: Physical Therapy

## 2021-12-12 ENCOUNTER — Other Ambulatory Visit: Payer: Self-pay

## 2021-12-12 ENCOUNTER — Ambulatory Visit: Payer: 59 | Admitting: Physical Therapy

## 2021-12-12 DIAGNOSIS — M79672 Pain in left foot: Secondary | ICD-10-CM | POA: Diagnosis not present

## 2021-12-12 DIAGNOSIS — M546 Pain in thoracic spine: Secondary | ICD-10-CM | POA: Diagnosis not present

## 2021-12-12 DIAGNOSIS — M25619 Stiffness of unspecified shoulder, not elsewhere classified: Secondary | ICD-10-CM | POA: Diagnosis not present

## 2021-12-12 DIAGNOSIS — M9902 Segmental and somatic dysfunction of thoracic region: Secondary | ICD-10-CM | POA: Diagnosis not present

## 2021-12-12 DIAGNOSIS — R262 Difficulty in walking, not elsewhere classified: Secondary | ICD-10-CM

## 2021-12-12 DIAGNOSIS — M7918 Myalgia, other site: Secondary | ICD-10-CM | POA: Diagnosis not present

## 2021-12-12 DIAGNOSIS — M9901 Segmental and somatic dysfunction of cervical region: Secondary | ICD-10-CM | POA: Diagnosis not present

## 2021-12-12 DIAGNOSIS — M6281 Muscle weakness (generalized): Secondary | ICD-10-CM

## 2021-12-17 ENCOUNTER — Ambulatory Visit (INDEPENDENT_AMBULATORY_CARE_PROVIDER_SITE_OTHER): Payer: 59 | Admitting: Licensed Clinical Social Worker

## 2021-12-17 ENCOUNTER — Ambulatory Visit: Payer: 59 | Admitting: Physical Therapy

## 2021-12-17 ENCOUNTER — Other Ambulatory Visit: Payer: Self-pay

## 2021-12-17 ENCOUNTER — Encounter: Payer: Self-pay | Admitting: Physical Therapy

## 2021-12-17 ENCOUNTER — Encounter: Payer: 59 | Admitting: Physical Therapy

## 2021-12-17 DIAGNOSIS — M79672 Pain in left foot: Secondary | ICD-10-CM | POA: Diagnosis not present

## 2021-12-17 DIAGNOSIS — R262 Difficulty in walking, not elsewhere classified: Secondary | ICD-10-CM | POA: Diagnosis not present

## 2021-12-17 DIAGNOSIS — M6281 Muscle weakness (generalized): Secondary | ICD-10-CM | POA: Diagnosis not present

## 2021-12-17 DIAGNOSIS — F411 Generalized anxiety disorder: Secondary | ICD-10-CM

## 2021-12-17 DIAGNOSIS — F1010 Alcohol abuse, uncomplicated: Secondary | ICD-10-CM

## 2021-12-17 NOTE — Therapy (Signed)
OUTPATIENT PHYSICAL THERAPY TREATMENT NOTE   Patient Name: Chase Nunez MRN: [id number] DOB:1976/05/17, 46 y.o., male Today's Date: 12/17/2021  PCP: Inda Coke, PA REFERRING PROVIDER: Inda Coke, PA   PT End of Session - 12/17/21 1004     Visit Number 3    Number of Visits 12    Date for PT Re-Evaluation 01/16/22    Authorization Type [place] [employer]    Progress Note Due on Visit 10    PT Start Time 0936    PT Stop Time 1015    PT Time Calculation (min) 39 min              Past Medical History:  Diagnosis Date   Alcohol consuption of more than two drinks per day    Anxiety    Arthritis    Depression    H/O drug abuse ([place])    none per patient since age 40   History of chicken pox    Past Surgical History:  Procedure Laterality Date   SHOULDER ARTHROSCOPY     Patient Active Problem List   Diagnosis Date Noted   Alcohol consuption of more than two drinks per day    Anxiety    Depression    Obsessive-compulsive disorder 12/17/2017   Generalized anxiety disorder 12/17/2017    REFERRING DIAG: M77.42 (ICD-10-CM) - Metatarsalgia of left foo  THERAPY DIAG:  Difficulty in walking, not elsewhere classified  Muscle weakness (generalized)  Pain in left foot  PERTINENT HISTORY: previous left medial ankle pain  PRECAUTIONS: none  SUBJECTIVE: States that he has been wearing the pad and using the tape and it seems to be helping. States that he is doing balance on the squishy mats.  PAIN:  Are you having pain? Yes VAS scale: 1/10 Pain location: bottom of foot Pain orientation: Left  PAIN TYPE: achy Pain description: intermittent  Aggravating factors: weightbearing Relieving factors: rest     OBJECTIVE:   DIAGNOSTIC FINDINGS: 11/19/21 xray  EXAM: LEFT FOOT - COMPLETE 3+ VIEW   FINDINGS: There is no evidence of fracture or dislocation. There is no evidence of arthropathy or other focal bone abnormality. Soft tissues are unremarkable.    IMPRESSION: Negative.      COGNITION:  Overall cognitive status: Within functional limits for tasks assessed     SENSATION:  Light touch: Appears intact   PALPATION: No tenderness to palpation along lower leg, foot or between toes, overall WNL joint mobility noted with metatarsals, hallux rigidus WNL Bilaterally  LE AROM/PROM:  A/PROM Right 12/05/2021 Left 12/05/2021  Hip flexion    Hip extension    Hip abduction    Hip adduction    Hip internal rotation    Hip external rotation    Knee flexion    Knee extension    Ankle Dorsiflexion knee straight 12 10  Ankle Plantarflexion knee straight 45 50  Ankle dorsiflexion knee bent 18 15  Ankle inversion 45 45  Ankle eversion 5 5   (Blank rows = not tested)  LE MMT:  MMT Right 12/05/2021 Left 12/05/2021  Hip flexion    Hip extension    Hip abduction    Hip adduction    Hip internal rotation    Hip external rotation    Knee flexion 5 5  Knee extension 5 5  Ankle dorsiflexion 5 5  Ankle plantarflexion 5 5*  Ankle inversion 5 5  Ankle eversion 5 5   (Blank rows = not tested)  *painful with single  leg heel raises    GAIT: Distance walked: reduced hallux extension and increased supination noted on left compared to right Assistive device utilized: None Level of assistance: Complete Independence Comments: NA    TODAY'S TREATMENT: 12/17/21 Therapeutic Exercise:  Aerobic: Supine:  Seated:  Standing:SLRDL - form and how to hold weight (contralateral side ) x10 Bilat; knee drive to airplane - holds and slow - prior demonstration - 18 minutes; SLS on blue foam x3 30" holds bilat; tandem on blue foam with head turns 4x5 5" holds in each position  Neuromuscular Re-education: Manual Therapy: Therapeutic Activity: Self Care: Trigger Point Dry Needling:  Modalities:     PATIENT EDUCATION:  Education details:on exercises and rationale for exercises Person educated: Patient Education method: Explanation,  Demonstration, and Handouts Education comprehension: verbalized understanding    HOME EXERCISE PROGRAM: [id number]  ASSESSMENT:  CLINICAL IMPRESSION: Patient is a 46 y.o. male who was seen today for physical therapy evaluation and treatment for left foot/toe pain. Patient presents with symptoms consistent plantar plate injury of the left second toe. Objective impairments include Abnormal gait, decreased activity tolerance, difficulty walking, decreased ROM, decreased strength, and pain. These impairments are limiting patient from occupation and recreational activities (pickle ball, running). Personal factors including Fitness are also affecting patient's functional outcome. Patient will benefit from skilled PT to address above impairments and improve overall function.  12/17/21 Patient tolerated session well. Focused on balance and distribution of weight with standing on one leg as he has a tendency to lock ou this foot into supination. This was improved with repetition. Minor pain noted end of last rep of tandem exercise but this resolved after a rest. Overall patient reported improvement in symptoms. Added new exercises to HEP and will continue with current POC.   REHAB POTENTIAL: Good  CLINICAL DECISION MAKING: Stable/uncomplicated  EVALUATION COMPLEXITY: Low   GOALS: Goals reviewed with patient? Yes  SHORT TERM GOALS:  STG Name Target Date Goal status  1 Patient will be independent in self management strategies to improve quality of life and functional outcomes. Baseline:  12/26/2021  INITIAL  2 Patient will report at least 50% improvement in overall symptoms and/or function to demonstrate improved functional mobility Baseline: 0% 12/26/2021 INITIAL  3 Patient will be able to report no pain along toe with walking  Baseline: current pain 12/26/2021 INITIAL  4  Baseline:    5  Baseline:    6  Baseline:    7  Baseline:     LONG TERM GOALS:   LTG Name Target Date Goal status   1 Patient will report at least 75% improvement in overall symptoms and/or function to demonstrate improved functional mobility Baseline:0% 01/16/2022  INITIAL  2 Patient will be able to demonstrate improved foot intrinsics by demonstrating isolated left hallux extension Baseline: 01/16/2022 INITIAL  3 Patient will be able to jump rope with shoes on and without pain to return to PLOF Baseline:unable 01/16/2022 INITIAL  4  Baseline:    5  Baseline:    6  Baseline:    7  Baseline:       PLAN: PT FREQUENCY: 2x/week  PT DURATION: 6 weeks  PLANNED INTERVENTIONS: Therapeutic exercises, Therapeutic activity, Neuro Muscular re-education, Balance training, Gait training, Patient/Family education, Joint mobilization, Dry Needling, Spinal mobilization, Cryotherapy, Moist heat, and Manual therapy  PLAN FOR NEXT SESSION: foot intrinsic mobility and strengthening (foot flat), balance for foot intrinsic, PF stretches, tpe taping   10:19 AM, 12/17/21 Jerene Pitch, DPT Physical Therapy  with [facility]  [phone] office

## 2021-12-24 ENCOUNTER — Encounter: Payer: Self-pay | Admitting: Physical Therapy

## 2021-12-24 ENCOUNTER — Ambulatory Visit: Payer: 59 | Admitting: Physical Therapy

## 2021-12-24 ENCOUNTER — Other Ambulatory Visit: Payer: Self-pay

## 2021-12-24 DIAGNOSIS — M6281 Muscle weakness (generalized): Secondary | ICD-10-CM | POA: Diagnosis not present

## 2021-12-24 DIAGNOSIS — M79672 Pain in left foot: Secondary | ICD-10-CM

## 2021-12-24 DIAGNOSIS — R262 Difficulty in walking, not elsewhere classified: Secondary | ICD-10-CM | POA: Diagnosis not present

## 2021-12-24 DIAGNOSIS — M7742 Metatarsalgia, left foot: Secondary | ICD-10-CM

## 2021-12-24 NOTE — Therapy (Signed)
OUTPATIENT PHYSICAL THERAPY TREATMENT NOTE   Patient Name: Chase Nunez MRN: [id number] DOB:[birth year], 46 y.o., male Today's Date: 12/24/2021  PCP: Inda Coke, PA REFERRING PROVIDER: Inda Coke, PA   PT End of Session - 12/24/21 1101     Visit Number 4    Number of Visits 12    Date for PT Re-Evaluation 01/16/22    Authorization Type [place] [employer]    Progress Note Due on Visit 10    PT Start Time 1101    PT Stop Time 1141    PT Time Calculation (min) 40 min               Past Medical History:  Diagnosis Date   Alcohol consuption of more than two drinks per day    Anxiety    Arthritis    Depression    H/O drug abuse ([place])    none per patient since age 68   History of chicken pox    Past Surgical History:  Procedure Laterality Date   SHOULDER ARTHROSCOPY     Patient Active Problem List   Diagnosis Date Noted   Alcohol consuption of more than two drinks per day    Anxiety    Depression    Obsessive-compulsive disorder 12/17/2017   Generalized anxiety disorder 12/17/2017    REFERRING DIAG: M77.42 (ICD-10-CM) - Metatarsalgia of left foo  THERAPY DIAG:  Difficulty in walking, not elsewhere classified  Muscle weakness (generalized)  Pain in left foot  Metatarsalgia of left foot  PERTINENT HISTORY: previous left medial ankle pain  PRECAUTIONS: none  SUBJECTIVE: States that he feels his foot is doing better. He has been doing his exercises and has been wearing his pad in his shoe and taping his toe and that seems to help.   PAIN:  Are you having pain? Yes VAS scale: 0/10 Pain location: bottom of foot Pain orientation: Left  PAIN TYPE: achy Pain description: intermittent  Aggravating factors: weightbearing Relieving factors: rest     OBJECTIVE:   DIAGNOSTIC FINDINGS: 11/19/21 xray  EXAM: LEFT FOOT - COMPLETE 3+ VIEW   FINDINGS: There is no evidence of fracture or dislocation. There is no evidence of arthropathy or other focal  bone abnormality. Soft tissues are unremarkable.   IMPRESSION: Negative.      COGNITION:  Overall cognitive status: Within functional limits for tasks assessed     SENSATION:  Light touch: Appears intact   PALPATION: No tenderness to palpation along lower leg, foot or between toes, overall WNL joint mobility noted with metatarsals, hallux rigidus WNL Bilaterally  LE AROM/PROM:  A/PROM Right 12/24/2021  Left 12/24/2021   Hip flexion    Hip extension    Hip abduction    Hip adduction    Hip internal rotation    Hip external rotation    Knee flexion    Knee extension    Ankle Dorsiflexion knee straight 12 15  Ankle Plantarflexion knee straight 50 55  Ankle dorsiflexion knee bent 18 18  Ankle inversion 45 45  Ankle eversion 5 5   (Blank rows = not tested)  LE MMT:  MMT Right 12/24/2021  Left 12/24/2021   Hip flexion    Hip extension    Hip abduction    Hip adduction    Hip internal rotation    Hip external rotation    Knee flexion 5 5  Knee extension 5 5  Ankle dorsiflexion 5 5  Ankle plantarflexion 5 5  Ankle inversion 5 5  Ankle eversion 5 5   (Blank rows = not tested)  *painful with single leg heel raises    GAIT: Distance walked: reduced hallux extension and increased supination noted on left compared to right Assistive device utilized: None Level of assistance: Complete Independence Comments: NA    TODAY'S TREATMENT: 12/24/21 Therapeutic Exercise:  Aerobic: Supine: Prone:  Seated:great toe extension 2 minutes Left   Standing: SLS on blue foam x2 30" holds Bilateral, SLS on floor eyes closed - x3 bilat max attempts Neuromuscular Re-education: Manual Therapy: Therapeutic Activity: Self Care: Trigger Point Dry Needling:  Modalities:    12/17/21 Therapeutic Exercise:  Aerobic: Supine:  Seated:   Standing:SLRDL - form and how to hold weight (contralateral side ) x10 Bilat; knee drive to airplane - holds and slow - prior demonstration  - 18 minutes; SLS on blue foam x3 30" holds bilat; tandem on blue foam with head turns 4x5 5" holds in each position  Neuromuscular Re-education: Manual Therapy: Therapeutic Activity: Self Care: Trigger Point Dry Needling:  Modalities:     PATIENT EDUCATION:  Education details:on exercises and rationale for exercises, on goals, on current POC, on HEP, on plan moving forward, on progress made Person educated: Patient Education method: Explanation, Demonstration, and Handouts Education comprehension: verbalized understanding    HOME EXERCISE PROGRAM: [id number]  ASSESSMENT:  CLINICAL IMPRESSION: Patient is a 46 y.o. male who was seen today for physical therapy evaluation and treatment for left foot/toe pain. Patient presents with symptoms consistent plantar plate injury of the left second toe. Objective impairments include Abnormal gait, decreased activity tolerance, difficulty walking, decreased ROM, decreased strength, and pain. These impairments are limiting patient from occupation and recreational activities (pickle ball, running). Personal factors including Fitness are also affecting patient's functional outcome. Patient will benefit from skilled PT to address above impairments and improve overall function.  Overall patient is doing well and progressing in strength, ROM and overall tolerance to different activated. Reviewed HEP and plan moving forward and discussed following up in 2 weeks with continued use of taping and pad with weight bearing exercises. Answered all questions and patient good with plan moving forward. Will follow up in 2 weeks, session focused on education and HEP review. Progressing towards goals  REHAB POTENTIAL: Good  CLINICAL DECISION MAKING: Stable/uncomplicated  EVALUATION COMPLEXITY: Low   GOALS: Goals reviewed with patient? Yes  SHORT TERM GOALS:  STG Name Target Date Goal status  1 Patient will be independent in self management strategies to  improve quality of life and functional outcomes. Baseline: daily HEP  12/26/2021  MET  2 Patient will report at least 50% improvement in overall symptoms and/or function to demonstrate improved functional mobility Baseline: 70% 12/26/2021 MET  3 Patient will be able to report no pain along toe with walking  Baseline: no pain with his shoes on 12/26/2021 MET  4  Baseline:    5  Baseline:    6  Baseline:    7  Baseline:     LONG TERM GOALS:   LTG Name Target Date Goal status  1 Patient will report at least 75% improvement in overall symptoms and/or function to demonstrate improved functional mobility Baseline:70% 01/16/2022  PARTIALLY MET  2 Patient will be able to demonstrate improved foot intrinsics by demonstrating isolated left hallux extension Baseline: abl 01/16/2022 MET  3 Patient will be able to jump rope with shoes on and without pain to return to PLOF with pad no severe pain - minor discomfort 01/16/2022 PARTIALLY  MET  4  Baseline:    5  Baseline:    6  Baseline:    7  Baseline:       PLAN: PT FREQUENCY: 2x/week  PT DURATION: 6 weeks  PLANNED INTERVENTIONS: Therapeutic exercises, Therapeutic activity, Neuro Muscular re-education, Balance training, Gait training, Patient/Family education, Joint mobilization, Dry Needling, Spinal mobilization, Cryotherapy, Moist heat, and Manual therapy  PLAN FOR NEXT SESSION: foot intrinsic mobility and strengthening (foot flat), balance for foot intrinsic, PF stretches, tpe taping   12:22 PM, 12/24/21 Jerene Pitch, DPT Physical Therapy with [facility]  [phone] office

## 2021-12-25 NOTE — Progress Notes (Signed)
° °  THERAPIST PROGRESS NOTE  Session Time: [id number]  Participation Level: Active  Behavioral Response: CasualAlert"neutral"  Type of Therapy: Individual Therapy  Treatment Goals addressed: Coping and Diagnosis: decrease alcohol use to no more than 2 drinks per day no more than 4 days per full 7 day week.  Interventions: CBT, Motivational Interviewing, Solution Focused, and Supportive  Summary: Chase Nunez is a 46 y.o. male who presents with alcohol abuse. Client showed limited progress toward goal AEB drinking more often or in larger quantity than intentioned since previous session. Client identified SMART goal and consequences to not meeting goals. Client in agreement to reach out to other providers available for continued services and will contact office if additional sessions or referrals are needed through the end of the month.  Suicidal/Homicidal: No  Therapist Response: Clinician checked in with client, assessed for SI/HI/psychosis and overall level of functioning including recent substance use. Clinician inquired about progress toward goal of decreasing use. Clinician and client problem solved consequences to implement for not meeting goals. Clinician processed with client internal motivation vs external pressure in relationships for changes in drinking habits. Clinician focused client on circle of control for personal thoughts/feelings/behaviors rather than behaviors of others. Clinician and client reviewed overall progress toward goals and discussed plans for changes in providers.  Plan: Return again in 2-3 weeks if needed. Client in agreement to seek other provider and contact office prior to clinician leaving for any additional assistance needed with referrals.  Diagnosis: Axis I: Alcohol Abuse     Olegario Messier, LCSW 12/17/2021

## 2021-12-28 NOTE — Progress Notes (Signed)
Cardiology Office Note:   Date:  12/31/2021  NAME:  Chase Nunez    MRN: [id number] DOB:  October 06, 1976   PCP:  Jarold Motto, PA  Cardiologist:  None  Electrophysiologist:  None   Referring MD: Jarold Motto, PA   Chief Complaint  Patient presents with   Follow-up    1 year.   History of Present Illness:   Chase Nunez is a 46 y.o. male with a hx of nonobstructive CAD, hyperlipidemia who presents for follow-up.  He reports he is doing well.  No significant chest pain symptoms.  Reports some palpitations but symptoms are well controlled.  He is working on diet and exercise.  Could do better with diet.  BP 106/76.  Well-controlled today.  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  Most recent cholesterol last year was well controlled.  Follow-up as #to be at goal.  Working on reducing alcohol consumption.  Denies symptoms today.  Problem List 1. CAD  -mild non-obstructive CAD 25-49% -CAC score 134 (99th percentile) 2. HLD -T chol 130, HDL 69, LDL 48, triglycerides 58  Past Medical History: Past Medical History:  Diagnosis Date   Alcohol consuption of more than two drinks per day    Anxiety    Arthritis    Depression    H/O drug abuse (HCC)    none per patient since age 4   History of chicken pox     Past Surgical History: Past Surgical History:  Procedure Laterality Date   SHOULDER ARTHROSCOPY      Current Medications: Current Meds  Medication Sig   diclofenac sodium (VOLTAREN) 1 % GEL Apply 4 g topically 4 (four) times daily. To affected joint. (Patient taking differently: Apply 4 g topically as needed. To affected joint.)   FLUoxetine (PROZAC) 40 MG capsule TAKE 2 CAPSULES BY MOUTH ONCE A DAY   ibuprofen (ADVIL,MOTRIN) 200 MG tablet Take 600-800 mg by mouth as needed.   Multiple Vitamins-Minerals (MULTIVITAMIN WITH MINERALS) tablet Take 1 tablet by mouth daily.   Omega-3 Fatty Acids (FISH OIL) 1000 MG CAPS Take by mouth.    pantoprazole (PROTONIX) 40 MG tablet TAKE 1 TABLET BY MOUTH ONCE DAILY   rosuvastatin (CRESTOR) 20 MG tablet TAKE 1 TABLET BY MOUTH ONCE DAILY     Allergies:    Patient has no known allergies.   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   Volunteer wrestling coach   Son 22 y/o in [employer]   Social Determinants of Health   Financial Resource Strain: Not on file  Food Insecurity: Not on file  Transportation Needs: Not on file  Physical Activity: Not on file  Stress: Not on file  Social Connections: Not on file  Family History: The patient's family history includes Alcohol abuse in his father; Arthritis in his father; Birth defects in his mother; COPD in his maternal grandfather and maternal grandmother; Diabetes in his paternal grandfather and paternal grandmother; Drug abuse in his father; Heart disease in his father, maternal grandfather, paternal grandfather, and paternal grandmother; High Cholesterol in his father; Hyperlipidemia in his maternal grandfather and paternal grandfather; Hypertension in his father; Kidney disease in his paternal grandmother; Miscarriages / [place] in his mother; Thyroid disease in his mother. There is no history of Colon cancer or Prostate cancer.  ROS:   All other ROS reviewed and negative. Pertinent positives noted in the HPI.     EKGs/Labs/Other Studies  Reviewed:   The following studies were personally reviewed by me today:  EKG:  EKG is ordered today.  The ekg ordered today demonstrates normal sinus rhythm heart rate 65, no acute ischemic changes or evidence of infarction, and was personally reviewed by me.   CCTA 09/06/2020 IMPRESSION: 1. Coronary calcium score of 134. This was 99th percentile for age and sex matched control.   2.  Normal coronary origin with right dominance.   3.  Mild atherosclerosis.  CAD RADS 2.   4.  Consider other causes of non-atherosclerotic chest pain.   5.  Recommend preventive therapy and risk factor modification.  Recent Labs: 02/02/2021: ALT 31; BUN 16; Creatinine, Ser 0.96; Hemoglobin 15.0; Platelets 184.0; Potassium 4.0; Sodium 136   Recent Lipid Panel    Component Value Date/Time   CHOL 130 01/10/2021 0928   TRIG 58 01/10/2021 0928   HDL 69 01/10/2021 0928   CHOLHDL 1.9 01/10/2021 0928   CHOLHDL 2.7 07/17/2020 1456   VLDL 9.6 03/05/2019 1053   LDLCALC 48 01/10/2021 0928   LDLCALC 110 (H) 07/17/2020 1456    Physical Exam:   VS:  BP 106/76 (BP Location: Left Arm, Patient Position: Sitting, Cuff Size: Large)    Pulse 65    Ht 5\' 6"  (1.676 m)    Wt 207 lb (93.9 kg)    BMI 33.41 kg/m    Wt Readings from Last 3 Encounters:  12/31/21 207 lb (93.9 kg)  11/19/21 205 lb 6.1 oz (93.2 kg)  05/18/21 198 lb 6.4 oz (90 kg)    General: Well nourished, well developed, in no acute distress Head: Atraumatic, normal size  Eyes: PEERLA, EOMI  Neck: Supple, no JVD Endocrine: No thryomegaly Cardiac: Normal S1, S2; RRR; no murmurs, rubs, or gallops Lungs: Clear to auscultation bilaterally, no wheezing, rhonchi or rales  Abd: Soft, nontender, no hepatomegaly  Ext: No edema, pulses 2+ Musculoskeletal: No deformities, BUE and BLE strength normal and equal Skin: Warm and dry, no rashes   Neuro: Alert and oriented to person, place, time, and situation, CNII-XII grossly intact, no focal deficits  Psych:  Normal mood and affect   ASSESSMENT:   Chase Nunez is a 46 y.o. male who presents for the following: 1. Coronary artery disease involving native coronary artery of native heart without angina pectoris   2. Agatston coronary artery calcium score between 100 and 199   3. Mixed hyperlipidemia     PLAN:   1. Coronary artery disease involving native coronary artery of native heart without angina pectoris 2. Agatston coronary artery calcium score between 100 and 199 3. Mixed hyperlipidemia -Evaluated in 2021 for chest pain symptoms.  CTA demonstrated nonobstructive CAD.  Coronary calcium score 134 which was the 99th percentile.  He is on Crestor 20 mg daily.  Most recent LDL cholesterol 48.  We discussed mainstay of treatment is prevention.  He will continue with proper diet and exercise.  BP is well controlled.  I have also discussed that he does not need aspirin.  There is no data suggest aspirin for primary prevention really is of little benefit unless calcium score is are above [phone].  He does not meet this criteria.  We will just continue with good cholesterol control.  He will see us back in 2 years.  Denies any major symptoms in office today.  Disposition: Return in about 2 years (around 01/01/2024).  Medication Adjustments/Labs and Tests Ordered: Current medicines are reviewed at length with the patient today.  Concerns regarding medicines are outlined above.  Orders Placed This Encounter  Procedures   EKG 12-Lead   No orders of the defined types were placed in this encounter.   Patient Instructions  Medication Instructions:  STOP aspirin   *If you need a refill on your cardiac medications before your next appointment, please call your pharmacy*  Follow-Up: At [facility], you and your health needs are our priority.  As part of our continuing mission to provide you with exceptional heart care, we have created designated Provider Care Teams.  These Care Teams include your  primary Cardiologist (physician) and Advanced Practice Providers (APPs -  Physician Assistants and Nurse Practitioners) who all work together to provide you with the care you need, when you need it.  We recommend signing up for the patient portal called "MyChart".  Sign up information is provided on this After Visit Summary.  MyChart is used to connect with patients for Virtual Visits (Telemedicine).  Patients are able to view lab/test results, encounter notes, upcoming appointments, etc.  Non-urgent messages can be sent to your provider as well.   To learn more about what you can do with MyChart, go to [email or web address].    Your next appointment:   2 year(s)  The format for your next appointment:   In Person  Provider:   Marjie Skiffallie Goodrich, PA-C, Azalee CourseHao Meng, PA-C, or Lennie OdorWesley O'Neal, MD       Time Spent with Patient: I have spent a total of 25 minutes with patient reviewing hospital notes, telemetry, EKGs, labs and examining the patient as well as establishing an assessment and plan that was discussed with the patient.  > 50% of time was spent in direct patient care.  Signed, Lenna GilfordWesley T. Flora Lipps'Neal, MD, [facility] [place]   [facility]  [address], [address] [place], [place] [address] [phone]  12/31/2021 8:20 AM

## 2021-12-31 ENCOUNTER — Encounter: Payer: Self-pay | Admitting: Cardiovascular Disease

## 2021-12-31 ENCOUNTER — Other Ambulatory Visit: Payer: Self-pay

## 2021-12-31 ENCOUNTER — Ambulatory Visit: Payer: 59 | Admitting: Cardiovascular Disease

## 2021-12-31 VITALS — BP 106/76 | HR 65 | Ht 66.0 in | Wt 207.0 lb

## 2021-12-31 DIAGNOSIS — R931 Abnormal findings on diagnostic imaging of heart and coronary circulation: Secondary | ICD-10-CM | POA: Diagnosis not present

## 2021-12-31 DIAGNOSIS — E782 Mixed hyperlipidemia: Secondary | ICD-10-CM

## 2021-12-31 DIAGNOSIS — I2510 Atherosclerotic heart disease of native coronary artery without angina pectoris: Secondary | ICD-10-CM | POA: Diagnosis not present

## 2021-12-31 NOTE — Patient Instructions (Signed)
Medication Instructions:  STOP aspirin   *If you need a refill on your cardiac medications before your next appointment, please call your pharmacy*  Follow-Up: At [facility], you and your health needs are our priority.  As part of our continuing mission to provide you with exceptional heart care, we have created designated Provider Care Teams.  These Care Teams include your primary Cardiologist (physician) and Advanced Practice Providers (APPs -  Physician Assistants and Nurse Practitioners) who all work together to provide you with the care you need, when you need it.  We recommend signing up for the patient portal called "MyChart".  Sign up information is provided on this After Visit Summary.  MyChart is used to connect with patients for Virtual Visits (Telemedicine).  Patients are able to view lab/test results, encounter notes, upcoming appointments, etc.  Non-urgent messages can be sent to your provider as well.   To learn more about what you can do with MyChart, go to [email or web address].    Your next appointment:   2 year(s)  The format for your next appointment:   In Person  Provider:   Marjie Skiff, PA-C, Azalee Course, PA-C, or Lennie Odor, MD

## 2022-01-07 ENCOUNTER — Encounter: Payer: Self-pay | Admitting: Physical Therapy

## 2022-01-07 ENCOUNTER — Other Ambulatory Visit: Payer: Self-pay

## 2022-01-07 ENCOUNTER — Ambulatory Visit: Payer: 59 | Admitting: Physical Therapy

## 2022-01-07 DIAGNOSIS — R262 Difficulty in walking, not elsewhere classified: Secondary | ICD-10-CM | POA: Diagnosis not present

## 2022-01-07 DIAGNOSIS — M6281 Muscle weakness (generalized): Secondary | ICD-10-CM | POA: Diagnosis not present

## 2022-01-07 DIAGNOSIS — M7742 Metatarsalgia, left foot: Secondary | ICD-10-CM | POA: Diagnosis not present

## 2022-01-07 DIAGNOSIS — M79672 Pain in left foot: Secondary | ICD-10-CM | POA: Diagnosis not present

## 2022-01-07 NOTE — Therapy (Signed)
OUTPATIENT PHYSICAL THERAPY TREATMENT NOTE and Discharge Note PHYSICAL THERAPY DISCHARGE SUMMARY  Visits from Start of Care: 5  Current functional level related to goals / functional outcomes: See below   Remaining deficits: Continued balance and occasional pain   Education / Equipment: See below   Patient agrees to discharge. Patient goals were partially met. Patient is being discharged due to being pleased with the current functional level.    Patient Name: Chase Nunez MRN: [id number] DOB:1976/07/22, 46 y.o., male Today's Date: 01/07/2022  PCP: Inda Coke, PA REFERRING PROVIDER: Inda Coke, PA   PT End of Session - 01/07/22 1014     Visit Number 5    Number of Visits 12    Date for PT Re-Evaluation 01/16/22    Authorization Type [employer] [employer]    Progress Note Due on Visit 10    PT Start Time 1015    PT Stop Time 1048    PT Time Calculation (min) 33 min               Past Medical History:  Diagnosis Date   Alcohol consuption of more than two drinks per day    Anxiety    Arthritis    Depression    H/O drug abuse ([place])    none per patient since age 7   History of chicken pox    Past Surgical History:  Procedure Laterality Date   SHOULDER ARTHROSCOPY     Patient Active Problem List   Diagnosis Date Noted   Alcohol consuption of more than two drinks per day    Anxiety    Depression    Obsessive-compulsive disorder 12/17/2017   Generalized anxiety disorder 12/17/2017    REFERRING DIAG: M77.42 (ICD-10-CM) - Metatarsalgia of left foo  THERAPY DIAG:  Difficulty in walking, not elsewhere classified  Muscle weakness (generalized)  Pain in left foot  Metatarsalgia of left foot  PERTINENT HISTORY: previous left medial ankle pain  PRECAUTIONS: none  SUBJECTIVE: States that things are still getting better. States that the tape was irritating  the top of the toe so he didn't tape it last Thursday and after training for a few hours he  felt that his toe was numb, cold and visible white. Reports that today it is better. States that overall he feels about 90% better and still gets a twinge every now and then.  PAIN:  Are you having pain? Yes VAS scale: 0/10 Pain location: bottom of foot Pain orientation: Left  PAIN TYPE: achy Pain description: intermittent  Aggravating factors: weightbearing Relieving factors: rest     OBJECTIVE:   DIAGNOSTIC FINDINGS: 11/19/21 xray  EXAM: LEFT FOOT - COMPLETE 3+ VIEW   FINDINGS: There is no evidence of fracture or dislocation. There is no evidence of arthropathy or other focal bone abnormality. Soft tissues are unremarkable.   IMPRESSION: Negative.      COGNITION:  Overall cognitive status: Within functional limits for tasks assessed     SENSATION:  Light touch: Appears intact   PALPATION: No tenderness to palpation along lower leg, foot or between toes, overall WNL joint mobility noted with metatarsals, hallux rigidus WNL Bilaterally  LE AROM/PROM:  A/PROM Right 01/07/2022  Left 01/07/2022   Hip flexion    Hip extension    Hip abduction    Hip adduction    Hip internal rotation    Hip external rotation    Knee flexion    Knee extension    Ankle Dorsiflexion knee straight 12  15  Ankle Plantarflexion knee straight 50 55  Ankle dorsiflexion knee bent 18 18  Ankle inversion 45 45  Ankle eversion 5 5   (Blank rows = not tested)  LE MMT:  MMT Right 01/07/2022  Left 01/07/2022   Hip flexion    Hip extension    Hip abduction    Hip adduction    Hip internal rotation    Hip external rotation    Knee flexion 5 5  Knee extension 5 5  Ankle dorsiflexion 5 5  Ankle plantarflexion 5 5  Ankle inversion 5 5  Ankle eversion 5 5   (Blank rows = not tested)  *painful with single leg heel raises    GAIT: Distance walked: reduced hallux extension and increased supination noted on left compared to right Assistive device utilized: None Level of  assistance: Complete Independence Comments: NA    TODAY'S TREATMENT: 1/30/2 Neuro re-ed: tandem balance on blue foam with diagonal head turns x15 bilat, with cervical rotation x15 bilat      PATIENT EDUCATION:  Education details:on rationale for injury and reduced blood flow, what to do if he feels his toe is falling asleep, with monitoring his toe between classes. On current POC and presentation.  Person educated: Patient Education method: Explanation, Demonstration, and Handouts Education comprehension: verbalized understanding    HOME EXERCISE PROGRAM: [email or web address]  ASSESSMENT:  CLINICAL IMPRESSION: Patient is a 46 y.o. male who was seen today for physical therapy evaluation and treatment for left foot/toe pain. Patient presents with symptoms consistent plantar plate injury of the left second toe. Objective impairments include Abnormal gait, decreased activity tolerance, difficulty walking, decreased ROM, decreased strength, and pain. These impairments are limiting patient from occupation and recreational activities (pickle ball, running). Personal factors including Fitness are also affecting patient's functional outcome. Patient will benefit from skilled PT to address above impairments and improve overall function.  Session focused on education and potential reasoning for reduced blood flow to injured toe. Discussed what to do when toe goes numb (ROM, exercises and monitor symptoms). Overall patient is doing well and continues to improve. Patient would like to discharge from PT at this time and focus on exercises for next 6 weeks. If pain returns patient to follow up with referring provider. All but one long term goal met at this time. Answered all questions prior to end of session.  REHAB POTENTIAL: Good  CLINICAL DECISION MAKING: Stable/uncomplicated  EVALUATION COMPLEXITY: Low   GOALS: Goals reviewed with patient? Yes  SHORT TERM GOALS:  STG Name Target Date Goal status   1 Patient will be independent in self management strategies to improve quality of life and functional outcomes. Baseline: daily HEP  12/26/2021  MET  2 Patient will report at least 50% improvement in overall symptoms and/or function to demonstrate improved functional mobility Baseline: 70% 12/26/2021 MET  3 Patient will be able to report no pain along toe with walking  Baseline: no pain with his shoes on 12/26/2021 MET  4  Baseline:    5  Baseline:    6  Baseline:    7  Baseline:     LONG TERM GOALS:   LTG Name Target Date Goal status  1 Patient will report at least 75% improvement in overall symptoms and/or function to demonstrate improved functional mobility Baseline:90% 01/16/2022  MET  2 Patient will be able to demonstrate improved foot intrinsics by demonstrating isolated left hallux extension Baseline: abl 01/16/2022 MET  3 Patient will be able to  jump rope with shoes on and without pain to return to PLOF with pad no severe pain - minor discomfort 01/16/2022 PARTIALLY MET  4  Baseline:    5  Baseline:    6  Baseline:    7  Baseline:       PLAN: PT FREQUENCY: 2x/week  PT DURATION: 6 weeks  PLANNED INTERVENTIONS: Therapeutic exercises, Therapeutic activity, Neuro Muscular re-education, Balance training, Gait training, Patient/Family education, Joint mobilization, Dry Needling, Spinal mobilization, Cryotherapy, Moist heat, and Manual therapy  PLAN FOR NEXT SESSION: DC to HEP  11:37 AM, 01/07/22 Jerene Pitch, DPT Physical Therapy with [facility]  [phone] office

## 2022-01-11 ENCOUNTER — Other Ambulatory Visit: Payer: Self-pay

## 2022-01-17 ENCOUNTER — Other Ambulatory Visit (HOSPITAL_COMMUNITY): Payer: Self-pay

## 2022-01-30 DIAGNOSIS — M546 Pain in thoracic spine: Secondary | ICD-10-CM | POA: Diagnosis not present

## 2022-01-30 DIAGNOSIS — M9902 Segmental and somatic dysfunction of thoracic region: Secondary | ICD-10-CM | POA: Diagnosis not present

## 2022-01-30 DIAGNOSIS — M9901 Segmental and somatic dysfunction of cervical region: Secondary | ICD-10-CM | POA: Diagnosis not present

## 2022-01-30 DIAGNOSIS — M7918 Myalgia, other site: Secondary | ICD-10-CM | POA: Diagnosis not present

## 2022-01-30 DIAGNOSIS — M25619 Stiffness of unspecified shoulder, not elsewhere classified: Secondary | ICD-10-CM | POA: Diagnosis not present

## 2022-02-02 ENCOUNTER — Other Ambulatory Visit (HOSPITAL_COMMUNITY): Payer: Self-pay

## 2022-03-14 ENCOUNTER — Other Ambulatory Visit (HOSPITAL_COMMUNITY): Payer: Self-pay

## 2022-03-15 IMAGING — DX DG HAND COMPLETE 3+V*L*
3 series · 3 of 3 positions shown · non-contrast
Comparison: Left wrist radiographs 04/07/2018.

CLINICAL DATA: Bilateral hand pain.

EXAM:
LEFT HAND - COMPLETE 3+ VIEW

[hand pa]
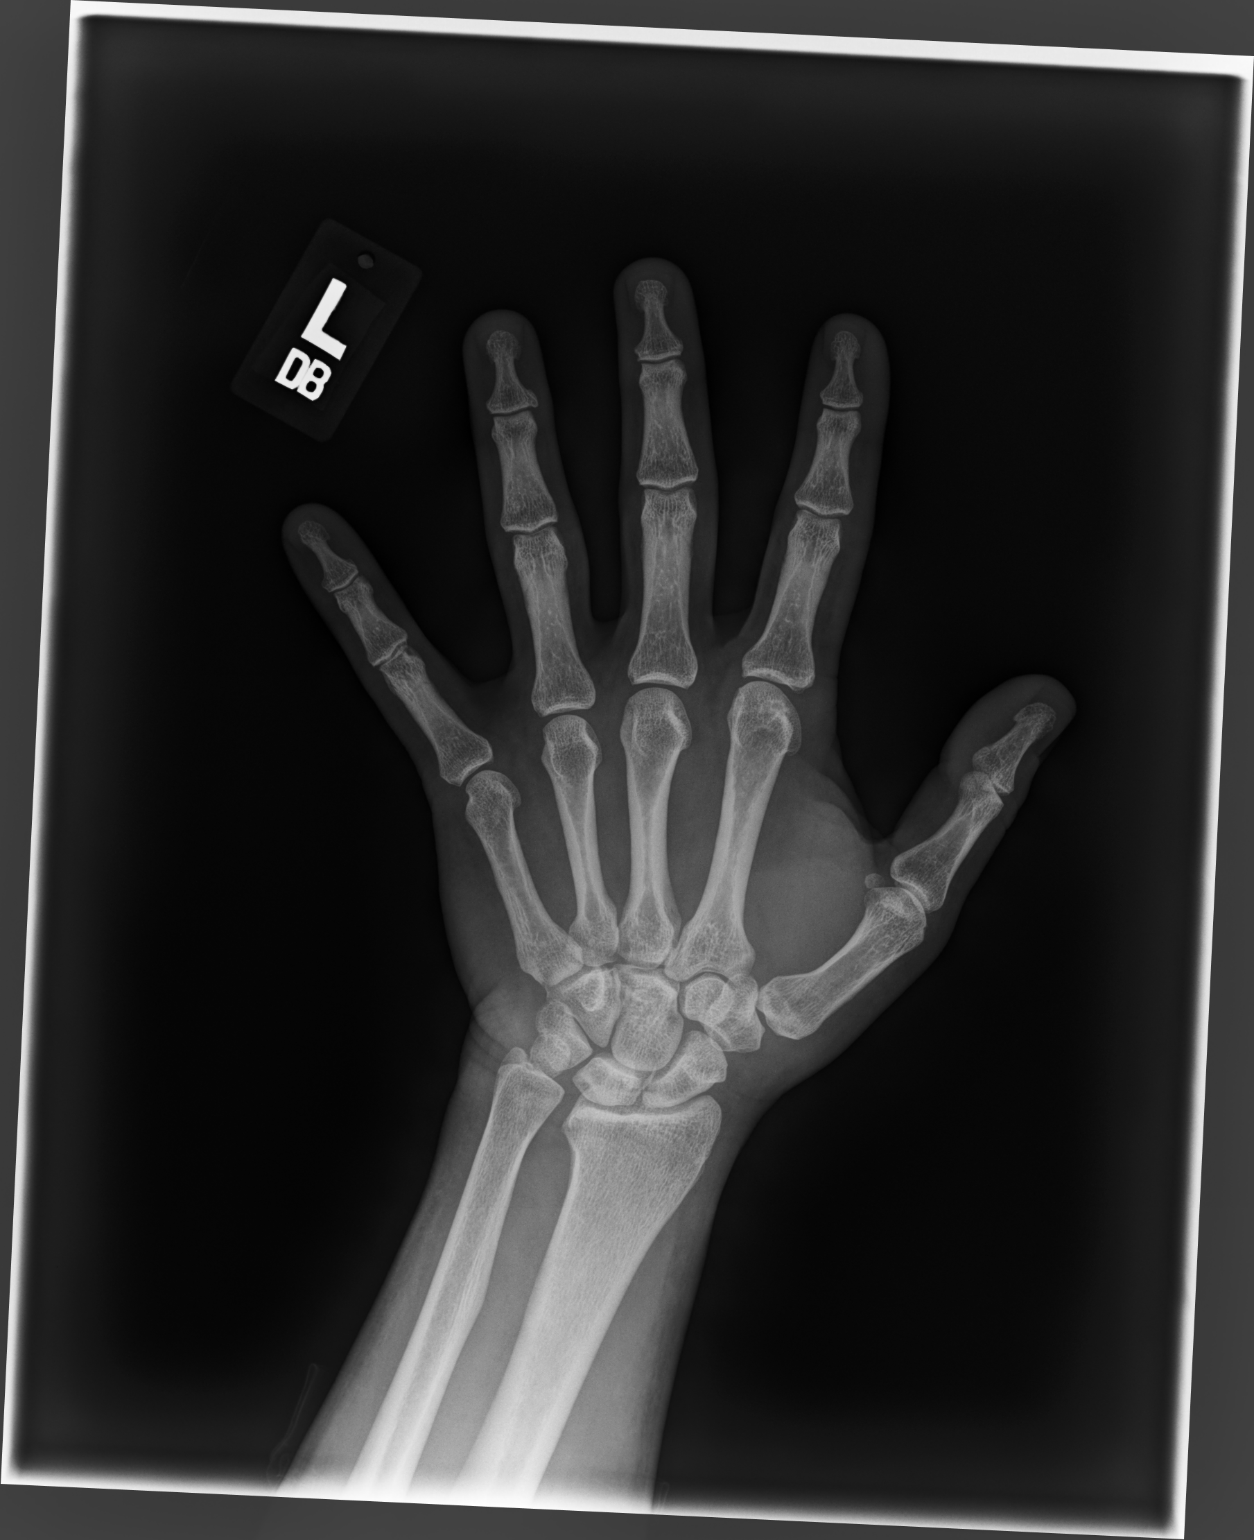

[hand oblique]
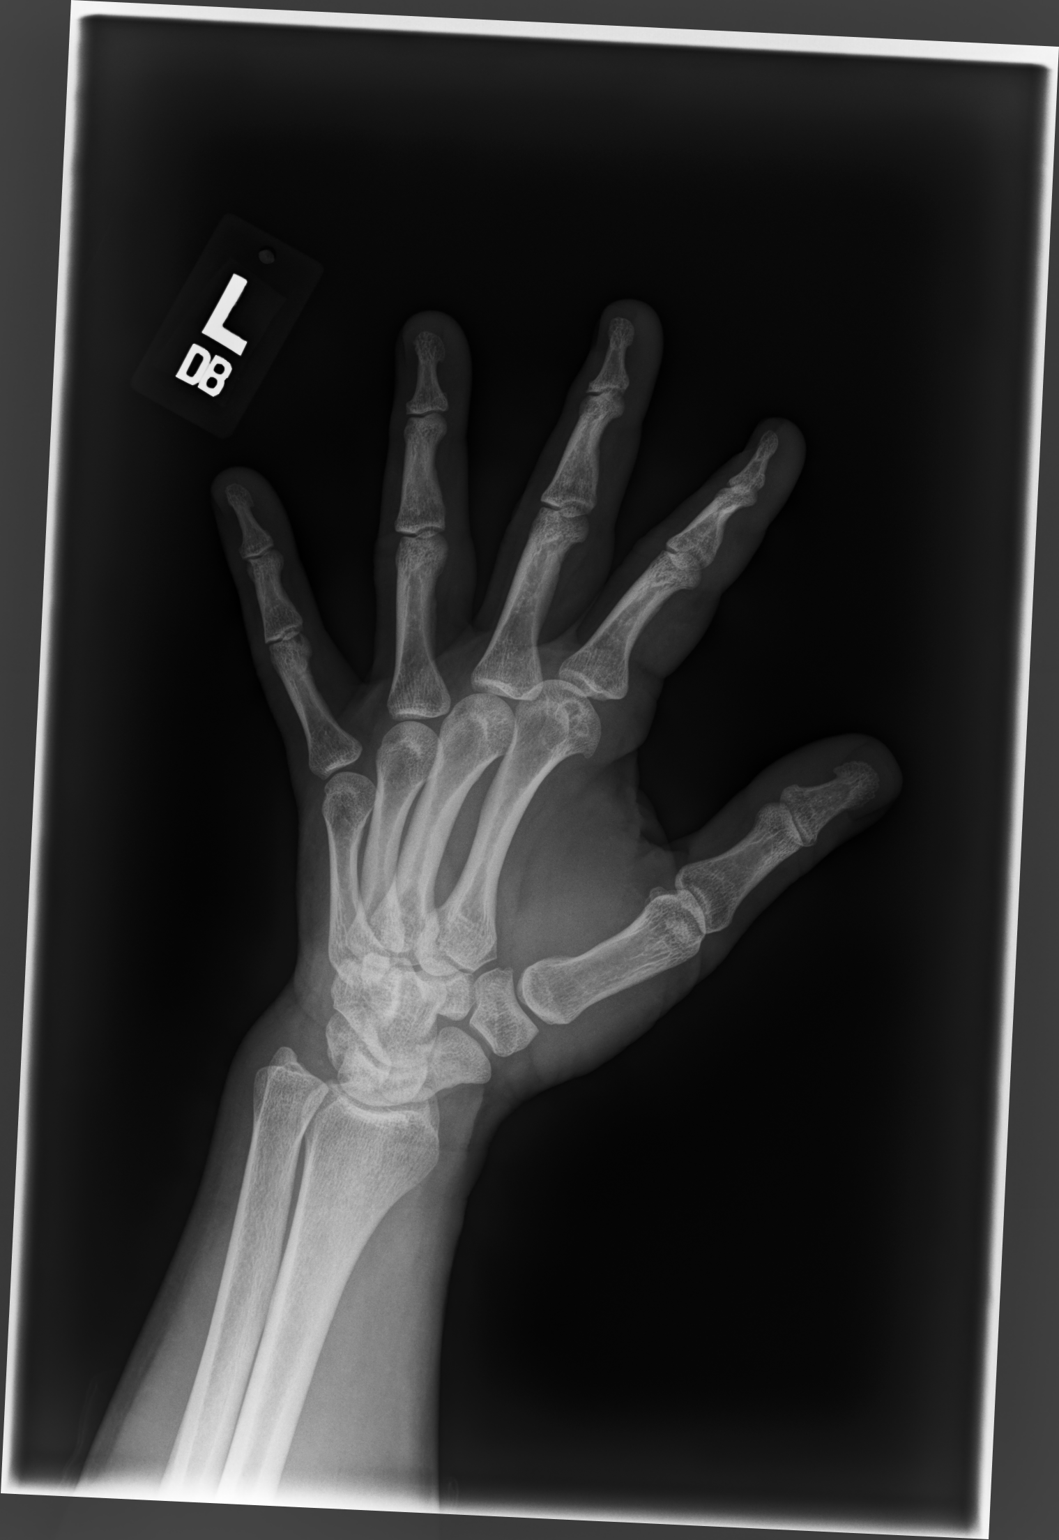

[hand lat]
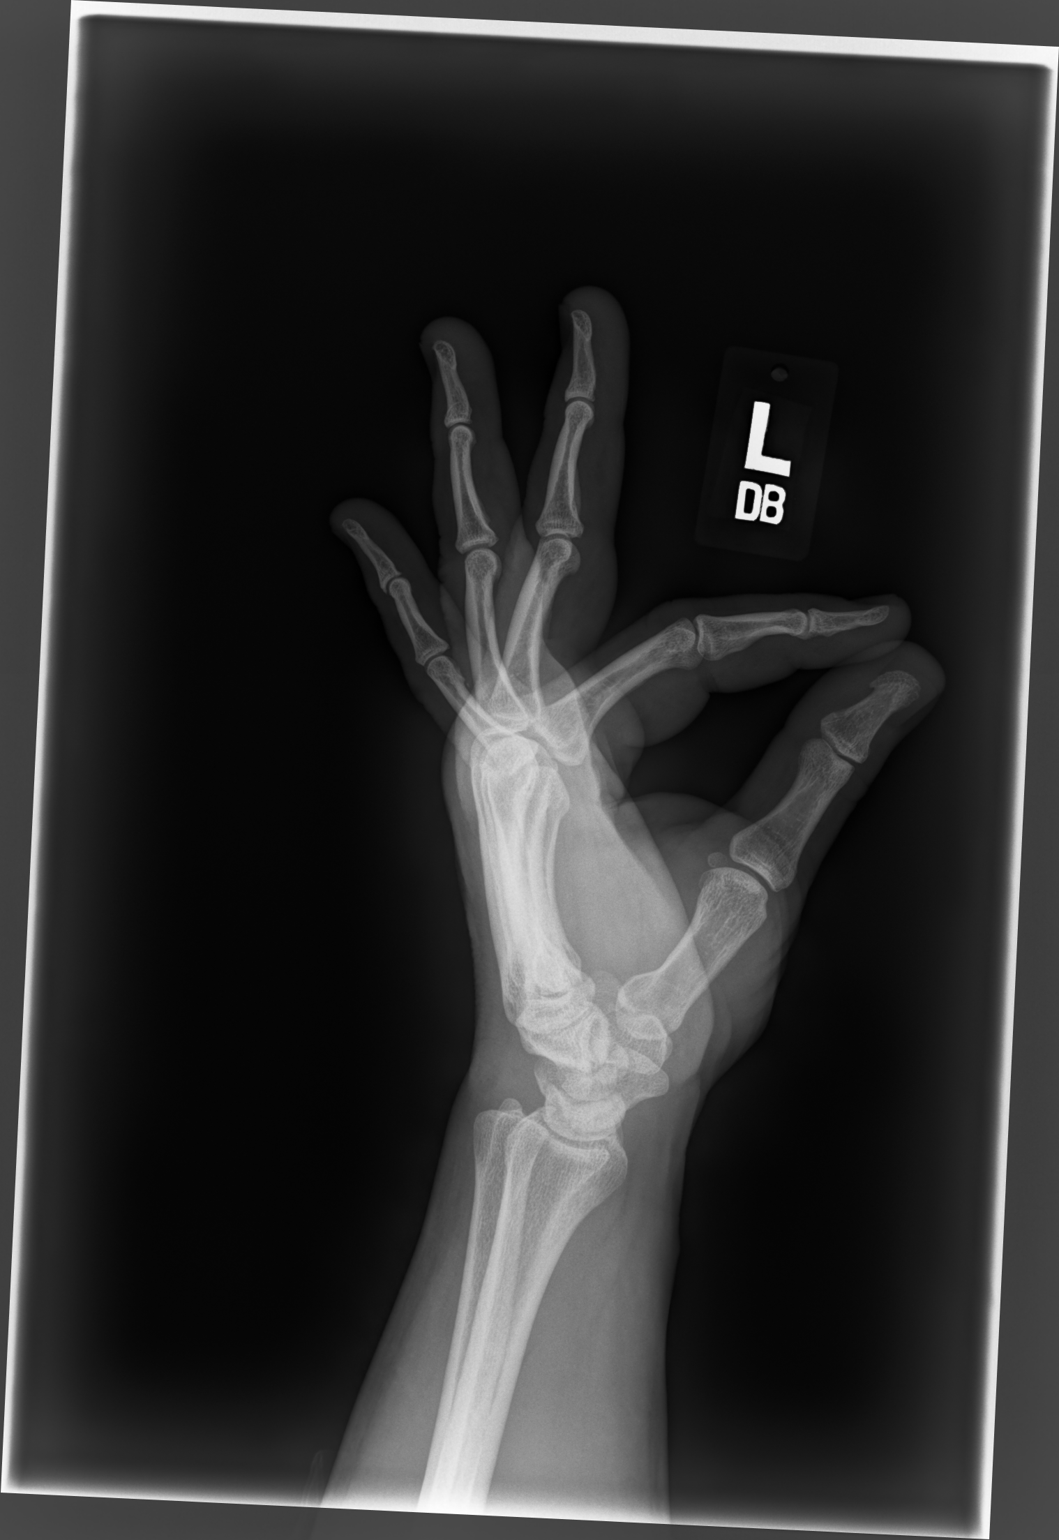

[3 of 3 positions shown; findings below may reference images not displayed]

FINDINGS: There is no evidence of fracture or dislocation. There is no
evidence of arthropathy or other focal bone abnormality. Soft
tissues are unremarkable.
IMPRESSION: Negative left hand radiographs.

## 2022-07-08 ENCOUNTER — Other Ambulatory Visit (HOSPITAL_COMMUNITY): Payer: Self-pay

## 2024-05-12 ENCOUNTER — Other Ambulatory Visit (HOSPITAL_BASED_OUTPATIENT_CLINIC_OR_DEPARTMENT_OTHER): Payer: Self-pay
